# Patient Record
Sex: Female | Born: 1980 | Race: White | Hispanic: No | Marital: Married | Smoking: Never smoker
Health system: Southern US, Community
[De-identification: ages and names within clinical notes are randomized; demographics above are authoritative.]

## PROBLEM LIST (undated history)

## (undated) DIAGNOSIS — N83202 Unspecified ovarian cyst, left side: Secondary | ICD-10-CM

## (undated) DIAGNOSIS — F419 Anxiety disorder, unspecified: Secondary | ICD-10-CM

## (undated) DIAGNOSIS — F32A Depression, unspecified: Secondary | ICD-10-CM

## (undated) DIAGNOSIS — N809 Endometriosis, unspecified: Secondary | ICD-10-CM

## (undated) DIAGNOSIS — N979 Female infertility, unspecified: Secondary | ICD-10-CM

## (undated) DIAGNOSIS — Z8742 Personal history of other diseases of the female genital tract: Secondary | ICD-10-CM

## (undated) DIAGNOSIS — Z973 Presence of spectacles and contact lenses: Secondary | ICD-10-CM

## (undated) DIAGNOSIS — F329 Major depressive disorder, single episode, unspecified: Secondary | ICD-10-CM

## (undated) HISTORY — DX: Depression, unspecified: F32.A

## (undated) HISTORY — DX: Anxiety disorder, unspecified: F41.9

## (undated) HISTORY — PX: WISDOM TOOTH EXTRACTION: SHX21

## (undated) HISTORY — DX: Major depressive disorder, single episode, unspecified: F32.9

---
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
## 2005-10-17 HISTORY — PX: NASAL SEPTUM SURGERY: SHX37

## 2011-10-25 ENCOUNTER — Ambulatory Visit (INDEPENDENT_AMBULATORY_CARE_PROVIDER_SITE_OTHER): Payer: BC Managed Care – PPO | Admitting: Obstetrics & Gynecology

## 2011-10-25 ENCOUNTER — Encounter: Payer: Self-pay | Admitting: Obstetrics & Gynecology

## 2011-10-25 ENCOUNTER — Other Ambulatory Visit: Payer: Self-pay | Admitting: Obstetrics & Gynecology

## 2011-10-25 DIAGNOSIS — F419 Anxiety disorder, unspecified: Secondary | ICD-10-CM | POA: Insufficient documentation

## 2011-10-25 DIAGNOSIS — Z803 Family history of malignant neoplasm of breast: Secondary | ICD-10-CM

## 2011-10-25 DIAGNOSIS — F411 Generalized anxiety disorder: Secondary | ICD-10-CM

## 2011-10-25 DIAGNOSIS — N942 Vaginismus: Secondary | ICD-10-CM

## 2011-10-25 DIAGNOSIS — F32A Depression, unspecified: Secondary | ICD-10-CM | POA: Insufficient documentation

## 2011-10-25 DIAGNOSIS — F329 Major depressive disorder, single episode, unspecified: Secondary | ICD-10-CM | POA: Insufficient documentation

## 2011-10-25 NOTE — Progress Notes (Signed)
Addended by: Granville Lewis on: 10/25/2011 05:01 PM   Modules accepted: Orders

## 2011-10-25 NOTE — Progress Notes (Signed)
  Subjective:     Sierra Morrison is a 31 y.o. female here for a routine exam.  Current complaints: painful sex, recurrent vaginitis.  Personal health questionnaire reviewed: yes.   Gynecologic History Patient's last menstrual period was 10/19/2011. Contraception: condoms and OCP (estrogen/progesterone) Last Pap: 9/12. Results were: normal Last mammogram: n/a.   Obstetric History OB History    Grav Para Term Preterm Abortions TAB SAB Ect Mult Living   0 0 0 0 0 0 0 0 0 0        The following portions of the patient's history were reviewed and updated as appropriate: allergies, current medications, past family history, past medical history, past social history, past surgical history and problem list.  Review of Systems A comprehensive review of systems was negative except for: Genitourinary: positive for vaginal discharge and dyspaurenia    Objective:    BP 113/74  Pulse 75  Temp(Src) 98 F (36.7 C) (Oral)  Resp 16  Ht 5\' 2"  (1.575 m)  Wt 118 lb (53.524 kg)  BMI 21.58 kg/m2  LMP 10/19/2011 General appearance: alert and no distress Head: Normocephalic, without obvious abnormality, atraumatic Neck: no adenopathy, supple, symmetrical, trachea midline and thyroid not enlarged, symmetric, no tenderness/mass/nodules Back: symmetric, no curvature. ROM normal. No CVA tenderness. Lungs: clear to auscultation bilaterally Breasts: normal appearance, no masses or tenderness Heart: regular rate and rhythm Abdomen: soft, non-tender; bowel sounds normal; no masses,  no organomegaly Pelvic: cervix normal in appearance, external genitalia normal, no adnexal masses or tenderness, no cervical motion tenderness, perianal skin: no external genital warts noted, rectovaginal septum normal, uterus normal size, shape, and consistency and discharge in vagina.  Tight introitus c/w vaginissmus.  uterus retroflexed, difficult to feel adnexa due to pt tightening abdomen. Extremities: extremities normal,  atraumatic, no cyanosis or edema    Assessment:    Healthy female exam.    Plan:    Contraception: OCP (estrogen/progesterone). Follow up in: 4 weeks. vaginal d/c--BD assue adn [facility] Needs BRCA testing.  Sierra Morrison to call to get blood drawn

## 2011-10-26 ENCOUNTER — Telehealth: Payer: Self-pay | Admitting: *Deleted

## 2011-10-26 LAB — WET PREP BY MOLECULAR PROBE
Candida species: NEGATIVE
Trichomonas vaginosis: NEGATIVE

## 2011-10-26 LAB — GC/CHLAMYDIA PROBE AMP, GENITAL: GC Probe Amp, Genital: NEGATIVE

## 2011-10-26 NOTE — Telephone Encounter (Signed)
LM for pt to call office if she wanted to have the [facility] testing done on herself since she has such a positive family history.

## 2011-11-03 ENCOUNTER — Other Ambulatory Visit (INDEPENDENT_AMBULATORY_CARE_PROVIDER_SITE_OTHER): Payer: BC Managed Care – PPO

## 2011-11-03 DIAGNOSIS — Z809 Family history of malignant neoplasm, unspecified: Secondary | ICD-10-CM

## 2011-11-03 NOTE — Progress Notes (Signed)
Pt here for [facility] only today.  Will notify pt when receive results.

## 2011-11-24 ENCOUNTER — Encounter: Payer: Self-pay | Admitting: Obstetrics & Gynecology

## 2011-11-30 ENCOUNTER — Encounter: Payer: Self-pay | Admitting: Obstetrics & Gynecology

## 2011-12-09 ENCOUNTER — Encounter: Payer: Self-pay | Admitting: Physician Assistant

## 2011-12-09 ENCOUNTER — Ambulatory Visit (INDEPENDENT_AMBULATORY_CARE_PROVIDER_SITE_OTHER): Payer: BC Managed Care – PPO | Admitting: Physician Assistant

## 2011-12-09 VITALS — BP 107/64 | HR 80 | Ht 62.5 in | Wt 118.0 lb

## 2011-12-09 DIAGNOSIS — Z23 Encounter for immunization: Secondary | ICD-10-CM

## 2011-12-09 DIAGNOSIS — Z7689 Persons encountering health services in other specified circumstances: Secondary | ICD-10-CM

## 2011-12-09 DIAGNOSIS — F329 Major depressive disorder, single episode, unspecified: Secondary | ICD-10-CM

## 2011-12-09 DIAGNOSIS — F341 Dysthymic disorder: Secondary | ICD-10-CM

## 2011-12-09 MED ORDER — SERTRALINE HCL 25 MG PO TABS: 25.0000 mg | ORAL_TABLET | Freq: Every day | ORAL | Status: DC

## 2011-12-09 NOTE — Progress Notes (Signed)
  Subjective:    Patient ID: Sierra Morrison, female    DOB: 1981-09-21, 31 y.o.   MRN: [id number]  HPI Patient presents to clinic to establish care. Patient past medical history was reviewed today with patient. She reports no problems or concerns today. She has an extensive history of anxiety and depression since youth. She believes that most of it is due to her "dysfunctional family" she states that when she is with them her anxiety gets worse. She recently had to be around them and she had 2 panic attacks; however, she felt great before and after. She really wants to go off of Zoloft. She had been on 100mg  daily but 3 months ago went down to 50mg  daily. Patient has a lot of good things happening in her life right now. She has a boyfriend of 5 years that she hopes to get engaged too. She is at a good place.   Review of Systems     Objective:   Physical Exam  Constitutional: She is oriented to person, place, and time. She appears well-developed and well-nourished.  HENT:  Head: Normocephalic and atraumatic.  Cardiovascular: Normal rate, regular rhythm, normal heart sounds and intact distal pulses.   Pulmonary/Chest: Effort normal and breath sounds normal.  Neurological: She is alert and oriented to person, place, and time.  Psychiatric: She has a normal mood and affect. Her behavior is normal.          Assessment & Plan:  Anxiety and Depression- Decrease Zoloft 25mg  daily for 2 months then stop. Encouraged patient to exercise and maintain an healthy diet. F/U with office if having any problems after discontinuing medications.   Tdap given today.

## 2011-12-09 NOTE — Patient Instructions (Signed)
Changed Zoloft to 25mg  daily for 2 months then stop. F/U when needed.

## 2011-12-21 ENCOUNTER — Encounter: Payer: Self-pay | Admitting: Emergency Medicine

## 2011-12-21 ENCOUNTER — Emergency Department
Admission: EM | Admit: 2011-12-21 | Discharge: 2011-12-21 | Disposition: A | Discharge status: Home / Self Care | Payer: BC Managed Care – PPO | Source: Home / Self Care | Attending: Family Medicine | Admitting: Family Medicine

## 2011-12-21 DIAGNOSIS — J069 Acute upper respiratory infection, unspecified: Secondary | ICD-10-CM

## 2011-12-21 MED ORDER — BENZONATATE 200 MG PO CAPS: 200.0000 mg | ORAL_CAPSULE | Freq: Every day | ORAL | Status: AC

## 2011-12-21 MED ORDER — AZITHROMYCIN 250 MG PO TABS
ORAL_TABLET | ORAL | Status: AC
Start: 2011-12-21 — End: 2011-12-26

## 2011-12-21 NOTE — ED Notes (Signed)
Congestion, runny nose, headache, left ear popping x 6 days

## 2011-12-21 NOTE — ED Provider Notes (Signed)
History     CSN: [id number]  Arrival date & time 12/21/11  1609   First MD Initiated Contact with Patient 12/21/11 1621      Chief Complaint  Patient presents with  . Sinus Problem     HPI Comments: Patient complains of approximately 5 day history of gradually progressive URI symptoms beginning with a mild sore throat (now improved), followed by progressive nasal congestion.  A cough started today.  Complains of fatigue and initial myalgias.  Cough has been generally non-productive.  There has been no pleuritic pain, shortness of breath, or wheezes.   The history is provided by the patient.    Past Medical History  Diagnosis Date  . Anxiety   . Depression   . Abnormal Pap smear of cervix     Past Surgical History  Procedure Date  . Wisdom tooth extraction   . Mole removal 2003    spit nevi    Family History  Problem Relation Age of Onset  . Cancer Mother     breast  . Cancer Mother     skin  . Cancer Maternal Grandmother     breast  . Cancer Maternal Aunt     breast    History  Substance Use Topics  . Smoking status: Never Smoker   . Smokeless tobacco: Never Used  . Alcohol Use: 0.6 oz/week    1 Glasses of wine per week     occassionally    OB History    Grav Para Term Preterm Abortions TAB SAB Ect Mult Living   0 0 0 0 0 0 0 0 0 0       Review of Systems + sore throat + cough No pleuritic pain No wheezing + nasal congestion + post-nasal drainage ? sinus pain/pressure No itchy/red eyes ? left earache No hemoptysis No SOB No fever/chills No nausea No vomiting No abdominal pain No diarrhea No urinary symptoms No skin rashes + fatigue + myalgias + headache Used OTC meds without relief  Allergies  Review of patient's allergies indicates not on file.  Home Medications   Current Outpatient Rx  Name Route Sig Dispense Refill  . AZITHROMYCIN 250 MG PO TABS  Take 2 tabs today; then begin one tab once daily for 4 more days (Rx void after  12/29/11) 6 each 0  . BENZONATATE 200 MG PO CAPS Oral Take 1 capsule (200 mg total) by mouth at bedtime. Take as needed for cough 12 capsule 0  . CALCIUM 1200-1000 MG-UNIT PO CHEW Oral Chew by mouth daily.      Marland Kitchen ONE-DAILY MULTI VITAMINS PO TABS Oral Take 1 tablet by mouth daily.      . SERTRALINE HCL 25 MG PO TABS Oral Take 1 tablet (25 mg total) by mouth daily. 30 tablet 1  . TRI-SPRINTEC 0.18/0.215/0.25 MG-35 MCG PO TABS        BP 111/71  Pulse 75  Temp(Src) 97.7 F (36.5 C) (Oral)  Resp 16  Ht 5\' 2"  (1.575 m)  Wt 121 lb (54.885 kg)  BMI 22.13 kg/m2  SpO2 99%  LMP 11/18/2011  Physical Exam Nursing notes and Vital Signs reviewed. Appearance:  Patient appears healthy, stated age, and in no acute distress Eyes:  Pupils are equal, round, and reactive to light and accomodation.  Extraocular movement is intact.  Conjunctivae are not inflamed  Ears:  Canals normal.  Tympanic membranes normal.  Nose:  Mildly congested turbinates.  No sinus tenderness.  Pharynx:  Normal Neck:  Supple.  Slightly tender shotty posterior nodes are palpated bilaterally  Lungs:  Clear to auscultation.  Breath sounds are equal.  Heart:  Regular rate and rhythm without murmurs, rubs, or gallops.  Abdomen:  Nontender without masses or hepatosplenomegaly.  Bowel sounds are present.  No CVA or flank tenderness.  Extremities:  No edema.  No calf tenderness Skin:  No rash present.   ED Course  Procedures none      1. Acute upper respiratory infections of unspecified site       MDM  There is no evidence of bacterial infection today.   Treat symptomatically for now: Rx written for Tessalon at bedtime. Take Mucinex D (guaifenesin with decongestant) twice daily for congestion.  Increase fluid intake, rest. May use Afrin nasal spray (or generic oxymetazoline) twice daily for about 5 days.  Also recommend using saline nasal spray several times daily and saline nasal irrigation (AYR is a common brand) Stop  all antihistamines for now, and other non-prescription cough/cold preparations. Begin Azithromycin if not improving about 5 days or if persistent fever develops. Follow-up with family doctor if not improving 7 to 10 days.        Donna Christen, MD 12/21/11 [phone]

## 2011-12-21 NOTE — Discharge Instructions (Signed)
Take Mucinex D (guaifenesin with decongestant) twice daily for congestion.  Increase fluid intake, rest. °May use Afrin nasal spray (or generic oxymetazoline) twice daily for about 5 days.  Also recommend using saline nasal spray several times daily and saline nasal irrigation (AYR is a common brand) °Stop all antihistamines for now, and other non-prescription cough/cold preparations. °Begin Azithromycin if not improving about 5 days or if persistent fever develops. °Follow-up with family doctor if not improving 7 to 10 days.  °

## 2012-02-28 ENCOUNTER — Other Ambulatory Visit: Payer: Self-pay | Admitting: Physician Assistant

## 2012-07-05 ENCOUNTER — Encounter: Payer: Self-pay | Admitting: Obstetrics & Gynecology

## 2012-07-05 ENCOUNTER — Ambulatory Visit (INDEPENDENT_AMBULATORY_CARE_PROVIDER_SITE_OTHER): Payer: BC Managed Care – PPO | Admitting: Obstetrics & Gynecology

## 2012-07-05 VITALS — BP 105/71 | HR 73 | Ht 62.5 in | Wt 117.0 lb

## 2012-07-05 DIAGNOSIS — Z1151 Encounter for screening for human papillomavirus (HPV): Secondary | ICD-10-CM

## 2012-07-05 DIAGNOSIS — Z01419 Encounter for gynecological examination (general) (routine) without abnormal findings: Secondary | ICD-10-CM

## 2012-07-05 DIAGNOSIS — Z113 Encounter for screening for infections with a predominantly sexual mode of transmission: Secondary | ICD-10-CM

## 2012-07-05 DIAGNOSIS — Z124 Encounter for screening for malignant neoplasm of cervix: Secondary | ICD-10-CM

## 2012-07-05 MED ORDER — NORGESTIM-ETH ESTRAD TRIPHASIC 0.18/0.215/0.25 MG-35 MCG PO TABS: ORAL_TABLET | ORAL | Status: DC

## 2012-07-05 NOTE — Patient Instructions (Signed)
Place premenopausal annual exam patient instructions here.  °

## 2012-07-05 NOTE — Addendum Note (Signed)
Addended by: Barbara Cower on: 07/05/2012 04:27 PM   Modules accepted: Orders

## 2012-07-05 NOTE — Progress Notes (Signed)
  Subjective:     Sierra Morrison is a 31 y.o. female here for a routine exam.  Current complaints: none.  Personal health questionnaire reviewed: yes.   Gynecologic History Patient's last menstrual period was 06/04/2012. Contraception: OCP (estrogen/progesterone) Last Pap: Nml per patient; has had an abnormal in the past requiring a repap.  Obstetric History OB History    Grav Para Term Preterm Abortions TAB SAB Ect Mult Living   0 0 0 0 0 0 0 0 0 0        The following portions of the patient's history were reviewed and updated as appropriate: allergies, current medications, past family history, past medical history, past social history, past surgical history and problem list.  Review of Systems A comprehensive review of systems was negative.    Objective:    Vitals:  WNL General appearance: alert, cooperative and no distress Head: Normocephalic, without obvious abnormality, atraumatic Eyes: negative Throat: lips, mucosa, and tongue normal; teeth and gums normal Lungs: clear to auscultation bilaterally Breasts: normal appearance, no masses or tenderness, No nipple retraction or dimpling, No nipple discharge or bleeding Heart: regular rate and rhythm Abdomen: soft, non-tender; bowel sounds normal; no masses,  no organomegaly Pelvic: cervix normal in appearance, external genitalia normal, no adnexal masses or tenderness, no bladder tenderness, no cervical motion tenderness, perianal skin: no external genital warts noted, introitus is tight due to muscle contraction (vaginismus)l, urethra without abnormality or discharge, uterus normal size, shape, and consistency and vagina normal without discharge. Extremities: no edema, redness or tenderness in the calves or thighs Skin: no lesions or rash Lymph nodes: Axillary adenopathy: none       Assessment:    Healthy female exam.    Plan:    Education reviewed: self breast exams and skin cancer screening. Contraception: oral  progesterone-only contraceptive. Follow up in: 1 year. Return to pelvic PT at [facility] when ready

## 2012-08-07 ENCOUNTER — Encounter: Payer: Self-pay | Admitting: Family Medicine

## 2012-08-07 ENCOUNTER — Ambulatory Visit: Payer: BC Managed Care – PPO | Admitting: Family Medicine

## 2012-08-07 ENCOUNTER — Ambulatory Visit (INDEPENDENT_AMBULATORY_CARE_PROVIDER_SITE_OTHER): Payer: BC Managed Care – PPO | Admitting: Family Medicine

## 2012-08-07 VITALS — BP 103/68 | HR 84 | Ht 62.0 in | Wt 118.0 lb

## 2012-08-07 DIAGNOSIS — F341 Dysthymic disorder: Secondary | ICD-10-CM

## 2012-08-07 DIAGNOSIS — F418 Other specified anxiety disorders: Secondary | ICD-10-CM

## 2012-08-07 MED ORDER — SERTRALINE HCL 50 MG PO TABS: 50.0000 mg | ORAL_TABLET | Freq: Every day | ORAL | Status: DC

## 2012-08-07 NOTE — Patient Instructions (Signed)

## 2012-08-07 NOTE — Progress Notes (Addendum)
  Subjective:    Patient ID: Sierra Morrison, female    DOB: [birth year], 31 y.o.   MRN: [id number]  HPI  Patient's here for anxiety and depression. She states she weaned herself from a high of 200 mg of Zoloft a day to 50 mg daily and when she saw Ms Caleen Essex in April she requested to come off the medication and she was weaned to half a tablet or 25 mg a day and then was able to come off. Initially things went well but she started having anxiety attacks feelings of depression and feeling overwhelmed with her life. She realized that she felt much better on the Zoloft and wants to go back on the medication.  Review of Systems  Psychiatric/Behavioral: Positive for dysphoric mood. Negative for suicidal ideas. The patient is nervous/anxious.       BP 103/68  Pulse 84  Ht 5\' 2"  (1.575 m)  Wt 118 lb (53.524 kg)  BMI 21.58 kg/m2  SpO2 94% Objective:   Physical Exam  Vitals reviewed. Constitutional: She is oriented to person, place, and time. She appears well-developed and well-nourished.  Neurological: She is alert and oriented to person, place, and time.  Skin: Skin is warm.  Psychiatric: Her behavior is normal. Thought content normal. Her mood appears anxious. Her speech is not delayed and not slurred. Cognition and memory are not impaired. She does not express impulsivity. She exhibits a depressed mood. She expresses no homicidal and no suicidal ideation. She expresses no suicidal plans and no homicidal plans.       Patient is tearful at times as she describes her panic attacks that she's been having and discusses her rosacea and then being engaged should be her happiest times and is not.   Gad -7 for anxiety score 15. 3 marked for #1 and #3 and #6, she marked 2  for #2 #7, and 1 marked for #4 and #5. She reports is very difficult for her to function take care of things and her  PHQ 9 was also marked total of 15    Assessment & Plan:  Anxiety depression syndrome. We'll restart on Zoloft 50 mg  one tablet a day.  We'll give her the privilege of increasing her Zoloft 50 mg every 2 weeks up to a maximum of 150. Followup with Ms.Breeback in 2 months.  #2 patient declines flu shot today.

## 2012-08-08 ENCOUNTER — Ambulatory Visit: Payer: BC Managed Care – PPO | Admitting: Physician Assistant

## 2012-08-13 ENCOUNTER — Ambulatory Visit: Payer: BC Managed Care – PPO | Admitting: Sports Medicine

## 2013-01-11 ENCOUNTER — Other Ambulatory Visit: Payer: Self-pay | Admitting: Physician Assistant

## 2013-06-28 ENCOUNTER — Other Ambulatory Visit: Payer: Self-pay | Admitting: Obstetrics & Gynecology

## 2013-07-01 ENCOUNTER — Ambulatory Visit: Payer: BC Managed Care – PPO

## 2013-07-01 ENCOUNTER — Telehealth: Payer: Self-pay | Admitting: *Deleted

## 2013-07-01 DIAGNOSIS — IMO0001 Reserved for inherently not codable concepts without codable children: Secondary | ICD-10-CM

## 2013-07-01 MED ORDER — NORGESTIM-ETH ESTRAD TRIPHASIC 0.18/0.215/0.25 MG-35 MCG PO TABS: ORAL_TABLET | ORAL | Status: DC

## 2013-07-01 NOTE — Telephone Encounter (Signed)
Pt called to have OCP refilled - Sent refill for 1 month and will make appt for pt to have annual

## 2013-07-15 ENCOUNTER — Other Ambulatory Visit: Payer: Self-pay | Admitting: *Deleted

## 2013-07-15 DIAGNOSIS — IMO0001 Reserved for inherently not codable concepts without codable children: Secondary | ICD-10-CM

## 2013-07-15 NOTE — Telephone Encounter (Signed)
Pt has an appt 07/19/13 for annual

## 2013-07-19 ENCOUNTER — Ambulatory Visit (INDEPENDENT_AMBULATORY_CARE_PROVIDER_SITE_OTHER): Payer: 59 | Admitting: Advanced Practice Midwife

## 2013-07-19 ENCOUNTER — Encounter: Payer: Self-pay | Admitting: Advanced Practice Midwife

## 2013-07-19 VITALS — BP 109/73 | HR 68 | Resp 16 | Ht 62.0 in | Wt 114.0 lb

## 2013-07-19 DIAGNOSIS — IMO0001 Reserved for inherently not codable concepts without codable children: Secondary | ICD-10-CM

## 2013-07-19 DIAGNOSIS — Z01419 Encounter for gynecological examination (general) (routine) without abnormal findings: Secondary | ICD-10-CM

## 2013-07-19 DIAGNOSIS — Z309 Encounter for contraceptive management, unspecified: Secondary | ICD-10-CM

## 2013-07-19 DIAGNOSIS — Z803 Family history of malignant neoplasm of breast: Secondary | ICD-10-CM

## 2013-07-19 MED ORDER — NORGESTIM-ETH ESTRAD TRIPHASIC 0.18/0.215/0.25 MG-35 MCG PO TABS: ORAL_TABLET | ORAL | Status: DC

## 2013-07-19 NOTE — Progress Notes (Signed)
   Subjective:     Sierra Morrison is a 32 y.o. female here for a routine well-woman exam.  Current complaints: none.  She is getting married in one month and would like to renew OCPs x1 month, then stop with plans to become pregnant.  She has hx of anxiety, and is taking Zoloft currently, and feels things are well managed currently.  She is concerned about antidepressants in pregnancy but has tried to wean off Zoloft before and did not do well.  She denies current vaginal bleeding, vaginal itching/burning, urinary symptoms, h/a, dizziness, n/v, or fever/chills.     Gynecologic History Patient's last menstrual period was 06/28/2013. Contraception: OCP (estrogen/progesterone) Last Pap: 2013. Results were: normal She is currently sexually active Menses are regular with OCPs, pt unsure if regular before because she was young, age 65, when starting pills  Health screening: Pt has routine eye exams, does light exercise 2-3x/week, wears seatbelt.  She does not have primary care provider and has not had routine cholesterol screening.  Obstetric History OB History  Gravida Para Term Preterm AB SAB TAB Ectopic Multiple Living  0 0 0 0 0 0 0 0 0 0          The following portions of the patient's history were reviewed and updated as appropriate: allergies, current medications, past family history, past medical history, past social history, past surgical history and problem list.  Review of Systems A comprehensive review of systems was negative.    Objective:    BP 109/73  Pulse 68  Resp 16  Ht 5\' 2"  (1.575 m)  Wt 114 lb (51.71 kg)  BMI 20.85 kg/m2  LMP 06/28/2013 General appearance: alert, cooperative, appears stated age and no distress Throat: lips, mucosa, and tongue normal; teeth and gums normal Lungs: clear to auscultation bilaterally Breasts: normal appearance, no masses or tenderness, Inspection negative, No nipple retraction or dimpling, No axillary or supraclavicular adenopathy,  Normal to palpation without dominant masses Heart: regular rate and rhythm, S1, S2 normal, no murmur, click, rub or gallop Abdomen: soft, non-tender; bowel sounds normal; no masses,  no organomegaly Pelvic: external genitalia normal, no adnexal masses or tenderness, no cervical motion tenderness, rectovaginal septum normal, uterus normal size, shape, and consistency and vagina normal without discharge Skin: Skin color, texture, turgor normal. No rashes or lesions Neurologic: Grossly normal    Assessment:    Healthy female exam.    Plan:    Education reviewed: depression evaluation, self breast exams and weight bearing exercise. OCP prescription renewed Pap not collected today--normal in 2013 Return in 1 year or PRN    Sharen Counter Certified Nurse-Midwife

## 2013-08-16 ENCOUNTER — Encounter: Payer: Self-pay | Admitting: Emergency Medicine

## 2013-08-16 ENCOUNTER — Emergency Department (INDEPENDENT_AMBULATORY_CARE_PROVIDER_SITE_OTHER)
Admission: EM | Admit: 2013-08-16 | Discharge: 2013-08-16 | Disposition: A | Discharge status: Home / Self Care | Payer: 59 | Source: Home / Self Care | Attending: Emergency Medicine | Admitting: Emergency Medicine

## 2013-08-16 DIAGNOSIS — H6980 Other specified disorders of Eustachian tube, unspecified ear: Secondary | ICD-10-CM

## 2013-08-16 DIAGNOSIS — H6500 Acute serous otitis media, unspecified ear: Secondary | ICD-10-CM

## 2013-08-16 DIAGNOSIS — H6502 Acute serous otitis media, left ear: Secondary | ICD-10-CM

## 2013-08-16 DIAGNOSIS — H6982 Other specified disorders of Eustachian tube, left ear: Secondary | ICD-10-CM

## 2013-08-16 MED ORDER — FLUTICASONE PROPIONATE 50 MCG/ACT NA SUSP: NASAL | Status: DC

## 2013-08-16 NOTE — ED Notes (Signed)
Sierra Morrison c/o left ear pain x 4 days. She was recently on vacation and feels she got water in her left ear. Mild  Congestion on the left side and HAs.

## 2013-08-16 NOTE — ED Provider Notes (Signed)
CSN: [id number]     Arrival date & time 08/16/13  1716 History   First MD Initiated Contact with Patient 08/16/13 1721     Chief Complaint  Patient presents with  . Otalgia   (Consider location/radiation/quality/duration/timing/severity/associated sxs/prior Treatment) HPI Left earache, pressure feeling, mild to moderate intensity, with mild decreased hearing and muffled feeling left ear. Started after she got water in her ear did snorkeling when she was on her honeymoon in [place]. [place] one week ago. Left nostril congested At times. No colored rhinorrhea. No ear drainage. No sore throat or other ENT symptoms. No fever. Has not tried any treatment for this Past Medical History  Diagnosis Date  . Anxiety   . Depression   . Abnormal Pap smear of cervix    Past Surgical History  Procedure Laterality Date  . Wisdom tooth extraction    . Mole removal  2003    spit nevi   Family History  Problem Relation Age of Onset  . Cancer Mother     breast  . Cancer Mother     skin  . Cancer Maternal Grandmother     breast  . Cancer Maternal Aunt     breast   History  Substance Use Topics  . Smoking status: Never Smoker   . Smokeless tobacco: Never Used  . Alcohol Use: 0.6 oz/week    1 Glasses of wine per week     Comment: occassionally   OB History   Grav Para Term Preterm Abortions TAB SAB Ect Mult Living   0 0 0 0 0 0 0 0 0 0      Review of Systems  All other systems reviewed and are negative.    Allergies  Review of patient's allergies indicates no known allergies.  Home Medications   Current Outpatient Rx  Name  Route  Sig  Dispense  Refill  . fluticasone (FLONASE) 50 MCG/ACT nasal spray      1 or 2 sprays in affected nostril(s) twice a day   16 g   0   . Norgestimate-Ethinyl Estradiol Triphasic (TRI-SPRINTEC) 0.18/0.215/0.25 MG-35 MCG tablet      Take one tablet daily by mouth   1 Package   1   . OVER THE COUNTER MEDICATION   Oral   Take 1 capsule by mouth  daily.         . sertraline (ZOLOFT) 50 MG tablet      TAKE 1-3 TABLETS (50-150 MG TOTAL) BY MOUTH DAILY.   60 tablet   4    BP 120/79  Pulse 65  Temp(Src) 98 F (36.7 C) (Oral)  Resp 14  Ht 5\' 3"  (1.6 m)  Wt 115 lb (52.164 kg)  BMI 20.38 kg/m2  SpO2 100%  LMP 07/13/2013 Physical Exam  Nursing note and vitals reviewed. Constitutional: She is oriented to person, place, and time. She appears well-developed and well-nourished. No distress.  HENT:  Head: Normocephalic and atraumatic.  Right Ear: Tympanic membrane, external ear and ear canal normal.  Left Ear: External ear and ear canal normal. No drainage or tenderness. No foreign bodies. No mastoid tenderness. Tympanic membrane is not injected, not scarred, not perforated, not erythematous and not bulging. Tympanic membrane mobility is abnormal. A middle ear effusion (Serous) is present. No hemotympanum.  Nose: Mucosal edema (Left nostril) present. No rhinorrhea. Right sinus exhibits no maxillary sinus tenderness and no frontal sinus tenderness. Left sinus exhibits no maxillary sinus tenderness and no frontal sinus tenderness.  Mouth/Throat: Oropharynx  is clear and moist. No oral lesions.  Eyes: Conjunctivae are normal. No scleral icterus.  Neck: Neck supple.  Cardiovascular: Normal rate, regular rhythm and normal heart sounds.   Pulmonary/Chest: Effort normal and breath sounds normal.  Lymphadenopathy:    She has no cervical adenopathy.  Neurological: She is alert and oriented to person, place, and time.  Skin: Skin is warm and dry.    ED Course  Procedures (including critical care time) Labs Review Labs Reviewed - No data to display Imaging Review No results found.  EKG Interpretation     Ventricular Rate:    PR Interval:    QRS Duration:   QT Interval:    QTC Calculation:   R Axis:     Text Interpretation:              MDM   1. Acute serous otitis media of left ear   2. Eustachian tube dysfunction,  left    Discussed diagnosis. After risks, benefits, alternatives discussed, she agrees with the following plans: Flonase. OTC Zyrtec Would expect this to resolve within one to 2 weeks, but if not, followup with PCP or ENT. Precautions discussed. Red flags discussed. Questions invited and answered. Patient voiced understanding and agreement.    Lajean Manes, MD 08/16/13 [phone]

## 2014-02-13 ENCOUNTER — Ambulatory Visit (INDEPENDENT_AMBULATORY_CARE_PROVIDER_SITE_OTHER): Payer: Commercial Managed Care - PPO | Admitting: Obstetrics & Gynecology

## 2014-02-13 ENCOUNTER — Encounter: Payer: Self-pay | Admitting: Obstetrics & Gynecology

## 2014-02-13 VITALS — BP 132/83 | HR 79 | Resp 16 | Ht 62.0 in | Wt 118.0 lb

## 2014-02-13 DIAGNOSIS — N940 Mittelschmerz: Secondary | ICD-10-CM

## 2014-02-13 MED ORDER — OXYCODONE-ACETAMINOPHEN 5-325 MG PO TABS: 1.0000 | ORAL_TABLET | Freq: Four times a day (QID) | ORAL | Status: DC | PRN

## 2014-02-13 NOTE — Progress Notes (Signed)
   Subjective:    Patient ID: Wendie SimmerKristen Lacomb, female    DOB: 22-Jul-1981, 33 y.o.   MRN: [id number]  HPI  33 yo MW G0 here today to discuss pelvic pain, midline, below the umbilicus for the last 10 days. She was seen at [facility] for some vaginal pain with pelvic PT. That issue has improved and is different from this pain. She is trying to get pregnant. Periods are "very regular"   Review of Systems     Objective:   Physical Exam        Assessment & Plan:  Mittelschmirz- reassurance given Rec IBU/percocet prn

## 2014-04-16 ENCOUNTER — Encounter: Payer: Self-pay | Admitting: Obstetrics & Gynecology

## 2014-04-16 ENCOUNTER — Ambulatory Visit (INDEPENDENT_AMBULATORY_CARE_PROVIDER_SITE_OTHER): Payer: Commercial Managed Care - PPO | Admitting: Obstetrics & Gynecology

## 2014-04-16 VITALS — BP 110/72 | HR 82 | Resp 16 | Ht 62.0 in | Wt 118.0 lb

## 2014-04-16 DIAGNOSIS — N946 Dysmenorrhea, unspecified: Secondary | ICD-10-CM

## 2014-04-16 NOTE — Progress Notes (Signed)
Pt presents to talk about bleeding pattern of menstrual cycle, infertility and dysmenorrhea.  Pt was on OCPs most of her life until 6 months ago.  Pt having brown spotting for 2 days, then red bleeding for 2 days then brown spotting for 2 days.  Bleeding is occurring once a month with no spotting mid cycle.  Pt reassured that this is e nml menstrual cycle albeit different then her bleeding pattern on the OCPs.  This is expected.  Pt's dysmenorrhea should be managed with motrin 600 mg q 6 hours.  Dr. Marice Potterove has also given her percocet for severe pain.  Diarrhea can also be nml with menstrual cycle due to prostaglandin release.    Pt should see us in 2-3 onths if not pregnant and we can order semen analysis and HSG.    Total time spent with patient 20 mins.

## 2014-04-23 ENCOUNTER — Ambulatory Visit: Payer: Commercial Managed Care - PPO | Admitting: Obstetrics & Gynecology

## 2014-06-15 ENCOUNTER — Encounter: Payer: Self-pay | Admitting: Emergency Medicine

## 2014-06-15 ENCOUNTER — Emergency Department (INDEPENDENT_AMBULATORY_CARE_PROVIDER_SITE_OTHER)
Admission: EM | Admit: 2014-06-15 | Discharge: 2014-06-15 | Disposition: A | Discharge status: Home / Self Care | Payer: Commercial Managed Care - PPO | Source: Home / Self Care | Attending: Family Medicine | Admitting: Family Medicine

## 2014-06-15 DIAGNOSIS — R350 Frequency of micturition: Secondary | ICD-10-CM

## 2014-06-15 DIAGNOSIS — N3000 Acute cystitis without hematuria: Secondary | ICD-10-CM

## 2014-06-15 DIAGNOSIS — R300 Dysuria: Secondary | ICD-10-CM

## 2014-06-15 DIAGNOSIS — R309 Painful micturition, unspecified: Secondary | ICD-10-CM

## 2014-06-15 LAB — POCT URINALYSIS DIP (MANUAL ENTRY)
BILIRUBIN UA: NEGATIVE
BILIRUBIN UA: NEGATIVE
Glucose, UA: NEGATIVE
NITRITE UA: NEGATIVE
PH UA: 7 (ref 5–8)
Spec Grav, UA: 1.02 (ref 1.005–1.03)
Urobilinogen, UA: 0.2 (ref 0–1)

## 2014-06-15 MED ORDER — NITROFURANTOIN MONOHYD MACRO 100 MG PO CAPS: 100.0000 mg | ORAL_CAPSULE | Freq: Two times a day (BID) | ORAL | Status: DC

## 2014-06-15 NOTE — ED Provider Notes (Signed)
CSN: [id number]     Arrival date & time 06/15/14  1634 History   First MD Initiated Contact with Patient 06/15/14 1648     Chief Complaint  Patient presents with  . Urinary Frequency  . Dysuria      HPI Comments: Patient complains of two day history of frequency, dysuria, and urgency.  No fevers, chills, and sweats.  Patient's last menstrual period was 05/25/2014.   Patient is a 33 y.o. female presenting with dysuria. The history is provided by the patient.  Dysuria Pain quality:  Burning Pain severity:  Moderate Onset quality:  Sudden Duration:  2 days Timing:  Constant Progression:  Worsening Chronicity:  New Recent urinary tract infections: no   Relieved by:  Nothing Worsened by:  Nothing tried Ineffective treatments:  None tried Urinary symptoms: frequent urination and hesitancy   Urinary symptoms: no discolored urine, no foul-smelling urine, no hematuria and no bladder incontinence   Associated symptoms: no abdominal pain, no fever, no flank pain, no genital lesions, no nausea, no vaginal discharge and no vomiting     Past Medical History  Diagnosis Date  . Anxiety   . Depression   . Abnormal Pap smear of cervix    Past Surgical History  Procedure Laterality Date  . Wisdom tooth extraction    . Mole removal  2003    spit nevi   Family History  Problem Relation Age of Onset  . Cancer Mother     breast  . Cancer Mother     skin  . Cancer Maternal Grandmother     breast  . Cancer Maternal Aunt     breast   History  Substance Use Topics  . Smoking status: Never Smoker   . Smokeless tobacco: Never Used  . Alcohol Use: 0.6 oz/week    1 Glasses of wine per week     Comment: occassionally   OB History   Grav Para Term Preterm Abortions TAB SAB Ect Mult Living       Review of Systems  Constitutional: Negative for fever.  Gastrointestinal: Negative for nausea, vomiting and abdominal pain.  Genitourinary: Positive for dysuria. Negative  for flank pain and vaginal discharge.  All other systems reviewed and are negative.   Allergies  Review of patient's allergies indicates no known allergies.  Home Medications   Prior to Admission medications   Medication Sig Start Date End Date Taking? Authorizing Provider  Prenatal MV-Min-Fe Fum-FA-DHA (PRENATAL 1 PO) Take by mouth.   Yes Historical Provider, MD  fluticasone (FLONASE) 50 MCG/ACT nasal spray 1 or 2 sprays in affected nostril(s) twice a day 08/16/13   Lajean Manes, MD  nitrofurantoin, macrocrystal-monohydrate, (MACROBID) 100 MG capsule Take 1 capsule (100 mg total) by mouth 2 (two) times daily. Take with food. 06/15/14   Lattie Haw, MD  oxyCODONE-acetaminophen (PERCOCET/ROXICET) 5-325 MG per tablet Take 1-2 tablets by mouth every 6 (six) hours as needed. 02/13/14   Allie Bossier, MD   BP 125/70  Pulse 75  Temp(Src) 98.5 F (36.9 C) (Oral)  Ht  (1.575 m)  Wt 119 lb (53.978 kg)  BMI 21.76 kg/m2  SpO2 100%  LMP 05/25/2014 Physical Exam Nursing notes and Vital Signs reviewed. Appearance:  Patient appears healthy, stated age, and in no acute distress Eyes:  Pupils are equal, round, and reactive to light and accomodation.  Extraocular movement is intact.  Conjunctivae are not inflamed  Pharynx:  Normal; moist mucous membranes  Neck:  Supple.  No adenopathy Lungs:  Clear to auscultation.  Breath sounds are equal.  Heart:  Regular rate and rhythm without murmurs, rubs, or gallops.  Abdomen:  Nontender without masses or hepatosplenomegaly.  Bowel sounds are present.  No CVA or flank tenderness.  Extremities:  No edema.  No calf tenderness Skin:  No rash present.   ED Course  Procedures  None   Labs Reviewed  POCT URINALYSIS DIP (MANUAL ENTRY) - Abnormal; Notable for the following:  BLO moderate; PRO /dL; LEU moderate  URINE CULTURE      MDM   1. Frequent urination   2. Painful urination   3. Acute cystitis without hematuria    Urine culture  pending.  Begin Macrobid.  Increase fluid intake. If symptoms become significantly worse during the night or over the weekend, proceed to the local emergency room.  Followup with Family Doctor if not improved in one week.    Lattie Haw, MD 06/20/14 [phone]

## 2014-06-15 NOTE — ED Notes (Signed)
Sierra Morrison complains of frequent and painful urination for 2 days. Denies fever, chills or sweats.

## 2014-06-15 NOTE — Discharge Instructions (Signed)
Increase fluid intake. °If symptoms become significantly worse during the night or over the weekend, proceed to the local emergency room.  ° ° °Urinary Tract Infection °Urinary tract infections (UTIs) can develop anywhere along your urinary tract. Your urinary tract is your body's drainage system for removing wastes and extra water. Your urinary tract includes two kidneys, two ureters, a bladder, and a urethra. Your kidneys are a pair of bean-shaped organs. Each kidney is about the size of your fist. They are located below your ribs, one on each side of your spine. °CAUSES °Infections are caused by microbes, which are microscopic organisms, including fungi, viruses, and bacteria. These organisms are so small that they can only be seen through a microscope. Bacteria are the microbes that most commonly cause UTIs. °SYMPTOMS  °Symptoms of UTIs may vary by age and gender of the patient and by the location of the infection. Symptoms in young women typically include a frequent and intense urge to urinate and a painful, burning feeling in the bladder or urethra during urination. Older women and men are more likely to be tired, shaky, and weak and have muscle aches and abdominal pain. A fever may mean the infection is in your kidneys. Other symptoms of a kidney infection include pain in your back or sides below the ribs, nausea, and vomiting. °DIAGNOSIS °To diagnose a UTI, your caregiver will ask you about your symptoms. Your caregiver also will ask to provide a urine sample. The urine sample will be tested for bacteria and white blood cells. White blood cells are made by your body to help fight infection. °TREATMENT  °Typically, UTIs can be treated with medication. Because most UTIs are caused by a bacterial infection, they usually can be treated with the use of antibiotics. The choice of antibiotic and length of treatment depend on your symptoms and the type of bacteria causing your infection. °HOME CARE  INSTRUCTIONS °· If you were prescribed antibiotics, take them exactly as your caregiver instructs you. Finish the medication even if you feel better after you have only taken some of the medication. °· Drink enough water and fluids to keep your urine clear or pale yellow. °· Avoid caffeine, tea, and carbonated beverages. They tend to irritate your bladder. °· Empty your bladder often. Avoid holding urine for long periods of time. °· Empty your bladder before and after sexual intercourse. °· After a bowel movement, women should cleanse from front to back. Use each tissue only once. °SEEK MEDICAL CARE IF:  °· You have back pain. °· You develop a fever. °· Your symptoms do not begin to resolve within 3 days. °SEEK IMMEDIATE MEDICAL CARE IF:  °· You have severe back pain or lower abdominal pain. °· You develop chills. °· You have nausea or vomiting. °· You have continued burning or discomfort with urination. °MAKE SURE YOU:  °· Understand these instructions. °· Will watch your condition. °· Will get help right away if you are not doing well or get worse. °Document Released: 07/13/2005 Document Revised: 04/03/2012 Document Reviewed: 11/11/2011 °[employer]® Patient Information ©2015 [employer]. This information is not intended to replace advice given to you by your health care provider. Make sure you discuss any questions you have with your health care provider. ° °

## 2014-06-18 ENCOUNTER — Telehealth: Payer: Self-pay | Admitting: *Deleted

## 2014-06-18 LAB — URINE CULTURE: Colony Count: 70000

## 2014-07-04 ENCOUNTER — Encounter: Payer: Self-pay | Admitting: Physician Assistant

## 2014-07-04 ENCOUNTER — Ambulatory Visit (INDEPENDENT_AMBULATORY_CARE_PROVIDER_SITE_OTHER): Payer: Commercial Managed Care - PPO | Admitting: Physician Assistant

## 2014-07-04 VITALS — BP 132/74 | HR 91 | Ht 62.0 in | Wt 120.0 lb

## 2014-07-04 DIAGNOSIS — F3289 Other specified depressive episodes: Secondary | ICD-10-CM

## 2014-07-04 DIAGNOSIS — F411 Generalized anxiety disorder: Secondary | ICD-10-CM

## 2014-07-04 DIAGNOSIS — R1030 Lower abdominal pain, unspecified: Secondary | ICD-10-CM

## 2014-07-04 DIAGNOSIS — F32A Depression, unspecified: Secondary | ICD-10-CM

## 2014-07-04 DIAGNOSIS — F329 Major depressive disorder, single episode, unspecified: Secondary | ICD-10-CM

## 2014-07-04 DIAGNOSIS — R109 Unspecified abdominal pain: Secondary | ICD-10-CM

## 2014-07-04 LAB — POCT URINALYSIS DIPSTICK
BILIRUBIN UA: NEGATIVE
Blood, UA: NEGATIVE
GLUCOSE UA: NEGATIVE
Ketones, UA: NEGATIVE
LEUKOCYTES UA: NEGATIVE
Nitrite, UA: NEGATIVE
Protein, UA: NEGATIVE
Spec Grav, UA: 1.015
UROBILINOGEN UA: 0.2
pH, UA: 7

## 2014-07-04 MED ORDER — SERTRALINE HCL 50 MG PO TABS: ORAL_TABLET | ORAL | Status: DC

## 2014-07-06 LAB — URINE CULTURE: Colony Count: >=100000

## 2014-07-07 MED ORDER — NITROFURANTOIN MONOHYD MACRO 100 MG PO CAPS: 100.0000 mg | ORAL_CAPSULE | Freq: Two times a day (BID) | ORAL | Status: DC

## 2014-07-07 NOTE — Progress Notes (Signed)
   Subjective:    Patient ID: Sierra Morrison, female    DOB: 05/09/1981, 33 y.o.   MRN: [id number]  HPI Pt presents to the clinic with worsening anxiety and depression. She went on zoloft previously and felt amazing. She decided to try going off of it and things have not been good. She is down and finds herself crying. She is much more worried and cannot sleep. They have been trying for over a 11 months to get pregnant with no success. She is having problems sleeping. Denies any suicidal or homicidal thoughts.   She does have new lower abdominal pain for last few days. Seems like urination is a little more frequent. Some low left flank pain. Not tried anything. No fever, chills, n/v/d. Negative pregnancy test with period September 8th.    Review of Systems  All other systems reviewed and are negative.      Objective:   Physical Exam  Constitutional: She is oriented to person, place, and time. She appears well-developed and well-nourished.  HENT:  Head: Normocephalic and atraumatic.  Cardiovascular: Normal rate, regular rhythm and normal heart sounds.   Pulmonary/Chest: Effort normal and breath sounds normal.  No CVA tenderness.   Abdominal: Soft. Bowel sounds are normal. She exhibits no distension and no mass. There is no tenderness. There is no rebound and no guarding.  Neurological: She is alert and oriented to person, place, and time.  Skin: Skin is dry.  Psychiatric: She has a normal mood and affect. Her behavior is normal.          Assessment & Plan:  Anxiety/depression- PHQ-9 was 17. GAD-7 was 12. Will start zoloft again. Discussed how staying on in pregnancy is ok and could be beneficial if you are going to be this up and down. Discuss more with OB/GYN. Follow up in 2 months.   Left flank pain/abdominal pain- .. Results for orders placed in visit on 07/04/14  URINE CULTURE      Result Value Ref Range   Colony Count >=100,000 COLONIES/ML     Organism ID, Bacteria  Multiple bacterial morphotypes present, none     Organism ID, Bacteria predominant. Suggest appropriate recollection if      Organism ID, Bacteria clinically indicated.    POCT URINALYSIS DIPSTICK      Result Value Ref Range   Color, UA yellow     Clarity, UA cloudy     Glucose, UA neg     Bilirubin, UA neg     Ketones, UA neg     Spec Grav, UA 1.015     Blood, UA neg     pH, UA 7.0     Protein, UA neg     Urobilinogen, UA 0.2     Nitrite, UA neg     Leukocytes, UA Negative     Cultured with bacteria seen more likely contamination but with quanity and symptoms. Called pt that we would treat with marobid. Follow up if symptoms worsen or do not resolve.

## 2014-09-03 ENCOUNTER — Encounter: Payer: Self-pay | Admitting: Physician Assistant

## 2014-09-03 ENCOUNTER — Ambulatory Visit (INDEPENDENT_AMBULATORY_CARE_PROVIDER_SITE_OTHER): Payer: Commercial Managed Care - PPO | Admitting: Physician Assistant

## 2014-09-03 VITALS — BP 96/62 | HR 61 | Ht 62.0 in | Wt 115.0 lb

## 2014-09-03 DIAGNOSIS — F329 Major depressive disorder, single episode, unspecified: Secondary | ICD-10-CM

## 2014-09-03 DIAGNOSIS — F32A Depression, unspecified: Secondary | ICD-10-CM

## 2014-09-03 DIAGNOSIS — F419 Anxiety disorder, unspecified: Secondary | ICD-10-CM

## 2014-09-03 MED ORDER — SERTRALINE HCL 100 MG PO TABS: 100.0000 mg | ORAL_TABLET | Freq: Every day | ORAL | Status: DC

## 2014-09-03 NOTE — Progress Notes (Signed)
   Subjective:    Patient ID: Wendie SimmerKristen Halley, female    DOB: [birth year], 33 y.o.   MRN: [id number]  HPI Patient is a 33 year old female who presents to the clinic with ongoing anxiety and depression. She is currently on Zoloft 100 mg and doing well. She feels very controlled on this dosage. She has a lot less down numbness as well as anxiety. She denies any suicidal or homicidal thoughts. She denies any side effects from medication.   Review of Systems  All other systems reviewed and are negative.      Objective:   Physical Exam  Constitutional: She is oriented to person, place, and time. She appears well-developed and well-nourished.  Cardiovascular: Normal rate, regular rhythm and normal heart sounds.   Neurological: She is alert and oriented to person, place, and time.  Psychiatric: She has a normal mood and affect. Her behavior is normal.          Assessment & Plan:  Anxiety and depression-GAD-7 was 2 and PHQ-9 was 2. Refilled Zoloft 100 mg for 6 months. Follow-up in 6 months.

## 2014-09-14 ENCOUNTER — Other Ambulatory Visit: Payer: Self-pay | Admitting: Physician Assistant

## 2014-10-14 ENCOUNTER — Ambulatory Visit (INDEPENDENT_AMBULATORY_CARE_PROVIDER_SITE_OTHER): Payer: Commercial Managed Care - PPO | Admitting: Obstetrics & Gynecology

## 2014-10-14 ENCOUNTER — Encounter: Payer: Self-pay | Admitting: Obstetrics & Gynecology

## 2014-10-14 VITALS — BP 117/72 | HR 74 | Resp 16 | Ht 62.0 in | Wt 118.0 lb

## 2014-10-14 DIAGNOSIS — N979 Female infertility, unspecified: Secondary | ICD-10-CM

## 2014-10-15 ENCOUNTER — Telehealth: Payer: Self-pay | Admitting: *Deleted

## 2014-10-15 LAB — TSH: TSH: 3.127 u[IU]/mL (ref 0.350–4.500)

## 2014-10-15 MED ORDER — LETROZOLE 2.5 MG PO TABS: ORAL_TABLET | ORAL | Status: DC

## 2014-10-15 NOTE — Progress Notes (Signed)
Pt has now been trying one year without successful pregnancy.  Pt nor husband has ever been pregnant.  Pt has montly cycles lasting about 6 days--spotting x2 days, nml flow x2 days, and then brown spotting x2 days.  This past month she had more spotting days prior to red flow.   Pt denies: GC/Chlam, appendicitis, gyn surgery, bowel issues, abdominal surgeries, hirsutism, Diabetes, psychotropic medications, galactorrhea.  Discussed options of fertility work up including US, semen analysis, HSG, referral to REI. Pt and husband opt for check TSH, and semen analysis. Will then try Femara 2.5 mg days 3-7 of cycle.  Will increase as necessary. HSG if not successful after 3 cycles.  20 minutes spent face to face with patient with >50% counseling.

## 2014-10-15 NOTE — Telephone Encounter (Signed)
LM on voicemail of normal TSH.

## 2015-01-27 ENCOUNTER — Ambulatory Visit: Payer: Commercial Managed Care - PPO | Admitting: Obstetrics & Gynecology

## 2015-02-02 ENCOUNTER — Encounter: Payer: Self-pay | Admitting: Obstetrics & Gynecology

## 2015-02-02 ENCOUNTER — Ambulatory Visit (INDEPENDENT_AMBULATORY_CARE_PROVIDER_SITE_OTHER): Payer: Commercial Managed Care - PPO | Admitting: Obstetrics & Gynecology

## 2015-02-02 VITALS — BP 116/68 | HR 70 | Resp 16 | Ht 62.0 in | Wt 119.0 lb

## 2015-02-02 DIAGNOSIS — N979 Female infertility, unspecified: Secondary | ICD-10-CM | POA: Diagnosis not present

## 2015-02-02 MED ORDER — LETROZOLE 2.5 MG PO TABS: ORAL_TABLET | ORAL | Status: DC

## 2015-02-02 NOTE — Progress Notes (Signed)
34 yo female presents for f/u of infertility.  Pt has taken femara for 3 months.  She had a normal menses with less spotting before (1 day spotting then flow).  Pt had a semen analysis which was nml.  TSH nml.  Pt would like to proceed with HSG and referral to REI for consult & more aggressive treatment.  Dr. Jeannie FendY likes to perform his own HSG so will refer her now so he can perform the HSG.    Will continue femara until she sees him.  Refill given.  Face to face time with patient is 15 minutes with >50% counseling of the following -fertility options -HSG procedure -Semen analysis results -TSH results -REI consult and treatments to expect

## 2015-03-04 ENCOUNTER — Encounter: Payer: Self-pay | Admitting: Family Medicine

## 2015-03-04 ENCOUNTER — Ambulatory Visit (INDEPENDENT_AMBULATORY_CARE_PROVIDER_SITE_OTHER): Payer: Commercial Managed Care - PPO | Admitting: Family Medicine

## 2015-03-04 VITALS — BP 102/66 | HR 67 | Wt 117.0 lb

## 2015-03-04 DIAGNOSIS — F411 Generalized anxiety disorder: Secondary | ICD-10-CM | POA: Diagnosis not present

## 2015-03-04 MED ORDER — SERTRALINE HCL 100 MG PO TABS: 100.0000 mg | ORAL_TABLET | Freq: Every day | ORAL | Status: DC

## 2015-03-04 NOTE — Progress Notes (Signed)
CC: Sierra Morrison is a 34 y.o. female is here for 6 month f/u   Subjective: HPI:  Follow-up anxiety: Taking Zoloft 100 mg on a daily basis without any noted side effects. Denies thoughts of wanting to harm self or others, depression, paranoia, hallucinations. She believes is helping greatly with reducing anxiety. Without this medication she has difficulty ignoring nervousness, excessive worrying and job responsibilities feel overwhelming. She's been on this medication off and on for almost 2 decades. Whenever she stops taking it within 2 or 3 months symptoms return and get to a degree that interfere with quality of life. Denies any other mental disturbance.     Review Of Systems Outlined In HPI  Past Medical History  Diagnosis Date  . Anxiety   . Depression   . Abnormal Pap smear of cervix     Past Surgical History  Procedure Laterality Date  . Wisdom tooth extraction    . Mole removal  2003    spit nevi   Family History  Problem Relation Age of Onset  . Cancer Mother     breast  . Cancer Mother     skin  . Cancer Maternal Grandmother     breast  . Cancer Maternal Aunt     breast    History   Social History  . Marital Status: Married    Spouse Name: N/A  . Number of Children: N/A  . Years of Education: N/A   Occupational History  . admission coordinator    Social History Main Topics  . Smoking status: Never Smoker   . Smokeless tobacco: Never Used  . Alcohol Use: 0.6 oz/week    1 Glasses of wine per week     Comment: occassionally  . Drug Use: No     Comment: While in college  . Sexual Activity:    Partners: Male    Pharmacist, hospitalBirth Control/ Protection: None   Other Topics Concern  . Not on file   Social History Narrative     Objective: BP 102/66 mmHg  Pulse 67  Wt 117 lb (53.071 kg)  LMP 01/14/2015  Vital signs reviewed. General: Alert and Oriented, No Acute Distress HEENT: Pupils equal, round, reactive to light. Conjunctivae clear.  External ears  unremarkable.  Moist mucous membranes. Lungs: Clear and comfortable work of breathing, speaking in full sentences without accessory muscle use. Cardiac: Regular rate and rhythm.  Neuro: CN II-XII grossly intact, gait normal. Extremities: No peripheral edema.  Strong peripheral pulses.  Mental Status: No depression, anxiety, nor agitation. Logical though process. Skin: Warm and dry. Assessment & Plan: Sierra HireKristen was seen today for 6 month f/u.  Diagnoses and all orders for this visit:  Generalized anxiety disorder Orders: -     sertraline (ZOLOFT) 100 MG tablet; Take 1 tablet (100 mg total) by mouth daily.   Anxiety: Controlled with Zoloft, continue daily regimen with suggested follow-up in 9 months given that she's been on this medication for so long and incredibly stable on this medication.  Return in about 9 months (around 12/05/2015).

## 2015-03-24 ENCOUNTER — Encounter (HOSPITAL_BASED_OUTPATIENT_CLINIC_OR_DEPARTMENT_OTHER): Payer: Self-pay | Admitting: *Deleted

## 2015-03-24 NOTE — Progress Notes (Signed)
NPO AFTER MN WITH EXCEPTION CLEAR LIQUIDS UNTIL 0700 (NO CREAM/ MILK PRODUCTS) .  ARRIVE AT 1115. NEEDS HG AND URINE PREG.  WILL TAKE AM MEDS W/ SIPS OF WATER.

## 2015-03-25 ENCOUNTER — Encounter (HOSPITAL_BASED_OUTPATIENT_CLINIC_OR_DEPARTMENT_OTHER): Payer: Self-pay | Admitting: *Deleted

## 2015-03-25 ENCOUNTER — Ambulatory Visit (HOSPITAL_BASED_OUTPATIENT_CLINIC_OR_DEPARTMENT_OTHER): Payer: Commercial Managed Care - PPO | Admitting: Anesthesiology

## 2015-03-25 ENCOUNTER — Ambulatory Visit (HOSPITAL_BASED_OUTPATIENT_CLINIC_OR_DEPARTMENT_OTHER)
Admission: RE | Admit: 2015-03-25 | Discharge: 2015-03-25 | Disposition: A | Discharge status: Home / Self Care | Payer: Commercial Managed Care - PPO | Source: Ambulatory Visit | Attending: Obstetrics and Gynecology | Admitting: Obstetrics and Gynecology

## 2015-03-25 ENCOUNTER — Encounter (HOSPITAL_BASED_OUTPATIENT_CLINIC_OR_DEPARTMENT_OTHER)
Admission: RE | Disposition: A | Discharge status: Home / Self Care | Payer: Self-pay | Source: Ambulatory Visit | Attending: Obstetrics and Gynecology

## 2015-03-25 DIAGNOSIS — F329 Major depressive disorder, single episode, unspecified: Secondary | ICD-10-CM | POA: Diagnosis not present

## 2015-03-25 DIAGNOSIS — F419 Anxiety disorder, unspecified: Secondary | ICD-10-CM | POA: Insufficient documentation

## 2015-03-25 DIAGNOSIS — N803 Endometriosis of pelvic peritoneum: Secondary | ICD-10-CM | POA: Diagnosis not present

## 2015-03-25 DIAGNOSIS — N8320 Unspecified ovarian cysts: Secondary | ICD-10-CM | POA: Insufficient documentation

## 2015-03-25 DIAGNOSIS — N736 Female pelvic peritoneal adhesions (postinfective): Secondary | ICD-10-CM | POA: Diagnosis not present

## 2015-03-25 DIAGNOSIS — N801 Endometriosis of ovary: Secondary | ICD-10-CM | POA: Diagnosis not present

## 2015-03-25 DIAGNOSIS — N979 Female infertility, unspecified: Secondary | ICD-10-CM | POA: Insufficient documentation

## 2015-03-25 DIAGNOSIS — N808 Other endometriosis: Secondary | ICD-10-CM | POA: Insufficient documentation

## 2015-03-25 HISTORY — DX: Unspecified ovarian cyst, left side: N83.202

## 2015-03-25 HISTORY — DX: Personal history of other diseases of the female genital tract: Z87.42

## 2015-03-25 HISTORY — DX: Female infertility, unspecified: N97.9

## 2015-03-25 HISTORY — DX: Presence of spectacles and contact lenses: Z97.3

## 2015-03-25 HISTORY — DX: Endometriosis, unspecified: N80.9

## 2015-03-25 HISTORY — PX: LAPAROSCOPIC OVARIAN CYSTECTOMY: SHX6248

## 2015-03-25 LAB — TYPE AND SCREEN
ABO/RH(D): O POS
Antibody Screen: NEGATIVE

## 2015-03-25 LAB — POCT PREGNANCY, URINE: PREG TEST UR: NEGATIVE

## 2015-03-25 LAB — ABO/RH: ABO/RH(D): O POS

## 2015-03-25 SURGERY — EXCISION, CYST, OVARY, LAPAROSCOPIC
Anesthesia: General | Site: Abdomen | Laterality: Left

## 2015-03-25 MED ORDER — MIDAZOLAM HCL 5 MG/5ML IJ SOLN
INTRAMUSCULAR | Status: DC | PRN
  Administered 2015-03-25: 2 mg via INTRAVENOUS

## 2015-03-25 MED ORDER — NEOSTIGMINE METHYLSULFATE 10 MG/10ML IV SOLN
INTRAVENOUS | Status: DC | PRN
  Administered 2015-03-25: 3 mg via INTRAVENOUS

## 2015-03-25 MED ORDER — MEPERIDINE HCL 25 MG/ML IJ SOLN
6.2500 mg | INTRAMUSCULAR | Status: DC | PRN
  Filled 2015-03-25: qty 1

## 2015-03-25 MED ORDER — ONDANSETRON HCL 4 MG PO TABS: 4.0000 mg | ORAL_TABLET | Freq: Three times a day (TID) | ORAL | Status: DC | PRN

## 2015-03-25 MED ORDER — BUPIVACAINE-EPINEPHRINE 0.25% -1:200000 IJ SOLN
INTRAMUSCULAR | Status: DC | PRN
Start: 2015-03-25 — End: 2015-03-25
  Administered 2015-03-25: 7 mL

## 2015-03-25 MED ORDER — DEXAMETHASONE SODIUM PHOSPHATE 4 MG/ML IJ SOLN
INTRAMUSCULAR | Status: DC | PRN
  Administered 2015-03-25: 10 mg via INTRAVENOUS

## 2015-03-25 MED ORDER — PROMETHAZINE HCL 25 MG/ML IJ SOLN
6.2500 mg | INTRAMUSCULAR | Status: DC | PRN
  Filled 2015-03-25: qty 1

## 2015-03-25 MED ORDER — CEFAZOLIN SODIUM-DEXTROSE 2-3 GM-% IV SOLR
INTRAVENOUS | Status: AC
  Filled 2015-03-25: qty 50

## 2015-03-25 MED ORDER — LACTATED RINGERS IV SOLN
INTRAVENOUS | Status: DC
  Administered 2015-03-25 (×2): via INTRAVENOUS
  Filled 2015-03-25: qty 1000

## 2015-03-25 MED ORDER — OXYCODONE HCL 5 MG PO TABS
2.5000 mg | ORAL_TABLET | Freq: Once | ORAL | Status: AC
  Administered 2015-03-25: 2.5 mg via ORAL
  Filled 2015-03-25: qty 1

## 2015-03-25 MED ORDER — FENTANYL CITRATE (PF) 100 MCG/2ML IJ SOLN
INTRAMUSCULAR | Status: DC | PRN
  Administered 2015-03-25: 25 ug via INTRAVENOUS
  Administered 2015-03-25: 50 ug via INTRAVENOUS
  Administered 2015-03-25: 25 ug via INTRAVENOUS
  Administered 2015-03-25: 50 ug via INTRAVENOUS
  Administered 2015-03-25: 25 ug via INTRAVENOUS
  Administered 2015-03-25 (×2): 50 ug via INTRAVENOUS
  Administered 2015-03-25: 25 ug via INTRAVENOUS

## 2015-03-25 MED ORDER — KETOROLAC TROMETHAMINE 30 MG/ML IJ SOLN
INTRAMUSCULAR | Status: DC | PRN
  Administered 2015-03-25: 30 mg via INTRAVENOUS

## 2015-03-25 MED ORDER — FENTANYL CITRATE (PF) 100 MCG/2ML IJ SOLN
INTRAMUSCULAR | Status: AC
  Filled 2015-03-25: qty 2

## 2015-03-25 MED ORDER — LACTATED RINGERS IR SOLN
Status: DC | PRN
  Administered 2015-03-25: 3000 mL

## 2015-03-25 MED ORDER — SUCCINYLCHOLINE CHLORIDE 20 MG/ML IJ SOLN
INTRAMUSCULAR | Status: DC | PRN
  Administered 2015-03-25: 100 mg via INTRAVENOUS

## 2015-03-25 MED ORDER — CEFAZOLIN SODIUM-DEXTROSE 2-3 GM-% IV SOLR
2.0000 g | INTRAVENOUS | Status: AC
  Administered 2015-03-25: 2 g via INTRAVENOUS
  Filled 2015-03-25: qty 50

## 2015-03-25 MED ORDER — ACETAMINOPHEN 10 MG/ML IV SOLN
INTRAVENOUS | Status: DC | PRN
  Administered 2015-03-25: 1000 mg via INTRAVENOUS

## 2015-03-25 MED ORDER — SODIUM CHLORIDE 0.9 % IR SOLN
Status: DC | PRN
  Administered 2015-03-25: 500 mL

## 2015-03-25 MED ORDER — PROPOFOL 10 MG/ML IV BOLUS
INTRAVENOUS | Status: DC | PRN
  Administered 2015-03-25: 50 mg via INTRAVENOUS
  Administered 2015-03-25: 160 mg via INTRAVENOUS

## 2015-03-25 MED ORDER — MIDAZOLAM HCL 2 MG/2ML IJ SOLN
INTRAMUSCULAR | Status: AC
  Filled 2015-03-25: qty 2

## 2015-03-25 MED ORDER — OXYCODONE-ACETAMINOPHEN 5-325 MG PO TABS
ORAL_TABLET | ORAL | Status: AC
  Filled 2015-03-25: qty 1

## 2015-03-25 MED ORDER — ROCURONIUM BROMIDE 100 MG/10ML IV SOLN
INTRAVENOUS | Status: DC | PRN
  Administered 2015-03-25: 25 mg via INTRAVENOUS
  Administered 2015-03-25 (×2): 5 mg via INTRAVENOUS

## 2015-03-25 MED ORDER — LIDOCAINE HCL (CARDIAC) 20 MG/ML IV SOLN
INTRAVENOUS | Status: DC | PRN
  Administered 2015-03-25: 70 mg via INTRAVENOUS

## 2015-03-25 MED ORDER — OXYCODONE-ACETAMINOPHEN 7.5-325 MG PO TABS: 1.0000 | ORAL_TABLET | ORAL | Status: DC | PRN

## 2015-03-25 MED ORDER — OXYCODONE HCL 5 MG PO TABS
ORAL_TABLET | ORAL | Status: AC
  Filled 2015-03-25: qty 1

## 2015-03-25 MED ORDER — LACTATED RINGERS IV SOLN
INTRAVENOUS | Status: DC
  Administered 2015-03-25: 15:00:00 via INTRAVENOUS
  Filled 2015-03-25: qty 1000

## 2015-03-25 MED ORDER — FENTANYL CITRATE (PF) 100 MCG/2ML IJ SOLN
INTRAMUSCULAR | Status: AC
  Filled 2015-03-25: qty 6

## 2015-03-25 MED ORDER — METHYLENE BLUE 1 % INJ SOLN
INTRAMUSCULAR | Status: DC | PRN
  Administered 2015-03-25: 1 mL via SUBMUCOSAL

## 2015-03-25 MED ORDER — ONDANSETRON HCL 4 MG/2ML IJ SOLN
INTRAMUSCULAR | Status: DC | PRN
Start: 2015-03-25 — End: 2015-03-25
  Administered 2015-03-25: 4 mg via INTRAVENOUS

## 2015-03-25 MED ORDER — GLYCOPYRROLATE 0.2 MG/ML IJ SOLN
INTRAMUSCULAR | Status: DC | PRN
  Administered 2015-03-25: 0.4 mg via INTRAVENOUS

## 2015-03-25 MED ORDER — FENTANYL CITRATE (PF) 100 MCG/2ML IJ SOLN
25.0000 ug | INTRAMUSCULAR | Status: DC | PRN
  Administered 2015-03-25 (×3): 50 ug via INTRAVENOUS
  Filled 2015-03-25: qty 1

## 2015-03-25 MED ORDER — OXYCODONE-ACETAMINOPHEN 5-325 MG PO TABS
1.0000 | ORAL_TABLET | ORAL | Status: DC | PRN
  Administered 2015-03-25: 1 via ORAL
  Filled 2015-03-25: qty 1

## 2015-03-25 SURGICAL SUPPLY — 49 items
BEAM PATH, ROBOTIC FIBER ×6 IMPLANT
BLADE SURG 11 STRL SS (BLADE) ×3 IMPLANT
BLADE SURG 15 STRL LF DISP TIS (BLADE) ×1 IMPLANT
BLADE SURG 15 STRL SS (BLADE) ×2
CATH FOLEY 2WAY SLVR  5CC 14FR (CATHETERS) ×2
CATH FOLEY 2WAY SLVR 5CC 14FR (CATHETERS) ×1 IMPLANT
CATH ROBINSON RED A/P 16FR (CATHETERS) ×3 IMPLANT
CLOSURE WOUND 1/2 X4 (GAUZE/BANDAGES/DRESSINGS) ×1
COVER MAYO STAND STRL (DRAPES) ×3 IMPLANT
DRAPE UNDERBUTTOCKS STRL (DRAPE) ×3 IMPLANT
ELECT REM PT RETURN 9FT ADLT (ELECTROSURGICAL) ×3
ELECTRODE REM PT RTRN 9FT ADLT (ELECTROSURGICAL) ×1 IMPLANT
FILTER SMOKE EVAC LAPAROSHD (FILTER) ×3 IMPLANT
GLOVE BIOGEL PI IND STRL 7.5 (GLOVE) ×2 IMPLANT
GLOVE BIOGEL PI IND STRL 8.5 (GLOVE) ×1 IMPLANT
GLOVE BIOGEL PI INDICATOR 7.5 (GLOVE) ×4
GLOVE BIOGEL PI INDICATOR 8.5 (GLOVE) ×2
GLOVE SURG SS PI 7.5 STRL IVOR (GLOVE) ×3 IMPLANT
GOWN STRL REUS W/ TWL LRG LVL3 (GOWN DISPOSABLE) ×2 IMPLANT
GOWN STRL REUS W/TWL LRG LVL3 (GOWN DISPOSABLE) ×4
MANIPULATOR UTERINE 4.5 ZUMI (MISCELLANEOUS) ×3 IMPLANT
NEEDLE HYPO 25X1 1.5 SAFETY (NEEDLE) ×3 IMPLANT
NEEDLE INSUFFLATION 14GA 120MM (NEEDLE) ×3 IMPLANT
NS IRRIG 500ML POUR BTL (IV SOLUTION) ×3 IMPLANT
PACK BASIN DAY SURGERY FS (CUSTOM PROCEDURE TRAY) ×3 IMPLANT
PACK LAPAROSCOPY II (CUSTOM PROCEDURE TRAY) ×3 IMPLANT
PAD OB MATERNITY 4.3X12.25 (PERSONAL CARE ITEMS) ×3 IMPLANT
PADDING ION DISPOSABLE (MISCELLANEOUS) ×3 IMPLANT
PENCIL BUTTON HOLSTER BLD 10FT (ELECTRODE) ×3 IMPLANT
SEPRAFILM MEMBRANE 5X6 (MISCELLANEOUS) ×6 IMPLANT
SET IRRIG TUBING LAPAROSCOPIC (IRRIGATION / IRRIGATOR) ×3 IMPLANT
SOLUTION ANTI FOG 6CC (MISCELLANEOUS) ×3 IMPLANT
STOPCOCK 4 WAY LG BORE MALE ST (IV SETS) ×3 IMPLANT
STRIP CLOSURE SKIN 1/2X4 (GAUZE/BANDAGES/DRESSINGS) ×2 IMPLANT
SUT MNCRL AB 4-0 PS2 18 (SUTURE) ×3 IMPLANT
SUT VIC AB 2-0 CT1 (SUTURE) ×3 IMPLANT
SUT VICRYL 6 0 RB 1 (SUTURE) ×3 IMPLANT
SYR 30ML LL (SYRINGE) ×3 IMPLANT
SYR 50ML LL SCALE MARK (SYRINGE) ×3 IMPLANT
SYR 5ML LL (SYRINGE) ×3 IMPLANT
SYR CONTROL 10ML LL (SYRINGE) ×9 IMPLANT
SYRINGE 10CC LL (SYRINGE) ×3 IMPLANT
SYS LAPSCP GELPORT 120MM (MISCELLANEOUS) ×3
SYSTEM LAPSCP GELPORT 120MM (MISCELLANEOUS) ×1 IMPLANT
TOWEL OR 17X24 6PK STRL BLUE (TOWEL DISPOSABLE) ×6 IMPLANT
TRAY DSU PREP LF (CUSTOM PROCEDURE TRAY) ×3 IMPLANT
TROCAR OPTI TIP 5M 100M (ENDOMECHANICALS) ×6 IMPLANT
TUBING INSUFFLATION 10FT LAP (TUBING) ×3 IMPLANT
WARMER LAPAROSCOPE (MISCELLANEOUS) ×3 IMPLANT

## 2015-03-25 NOTE — Anesthesia Procedure Notes (Signed)
Procedure Name: Intubation Date/Time: 03/25/2015 12:34 PM Performed by: Tyrone NineSAUVE, Terisha Losasso F Pre-anesthesia Checklist: Patient being monitored, Suction available, Emergency Drugs available, Timeout performed and Patient identified Patient Re-evaluated:Patient Re-evaluated prior to inductionOxygen Delivery Method: Circle system utilized Preoxygenation: Pre-oxygenation with 100% oxygen Intubation Type: IV induction Laryngoscope Size: Mac and 3 Grade View: Grade I Tube type: Oral Number of attempts: 1 Airway Equipment and Method: Stylet and Bite block Placement Confirmation: ETT inserted through vocal cords under direct vision,  positive ETCO2 and breath sounds checked- equal and bilateral Secured at: 20 cm Tube secured with: Tape Dental Injury: Teeth and Oropharynx as per pre-operative assessment

## 2015-03-25 NOTE — Anesthesia Preprocedure Evaluation (Addendum)
Anesthesia Evaluation  Patient identified by MRN, date of birth, ID band Patient awake    Reviewed: Allergy & Precautions, NPO status , Patient's Chart, lab work & pertinent test results  Airway Mallampati: II  TM Distance: >3 FB Neck ROM: Full    Dental no notable dental hx. (+) Teeth Intact, Dental Advisory Given   Pulmonary neg pulmonary ROS,  breath sounds clear to auscultation  Pulmonary exam normal       Cardiovascular Exercise Tolerance: Good negative cardio ROS Normal cardiovascular examRhythm:Regular Rate:Normal     Neuro/Psych negative neurological ROS  negative psych ROS   GI/Hepatic negative GI ROS, Neg liver ROS,   Endo/Other  negative endocrine ROS  Renal/GU negative Renal ROS  negative genitourinary   Musculoskeletal negative musculoskeletal ROS (+)   Abdominal   Peds negative pediatric ROS (+)  Hematology negative hematology ROS (+)   Anesthesia Other Findings   Reproductive/Obstetrics negative OB ROS                           Anesthesia Physical Anesthesia Plan  ASA: I  Anesthesia Plan: General   Post-op Pain Management:    Induction: Intravenous  Airway Management Planned: LMA  Additional Equipment:   Intra-op Plan:   Post-operative Plan: Extubation in OR  Informed Consent: I have reviewed the patients History and Physical, chart, labs and discussed the procedure including the risks, benefits and alternatives for the proposed anesthesia with the patient or authorized representative who has indicated his/her understanding and acceptance.   Dental advisory given  Plan Discussed with: CRNA  Anesthesia Plan Comments:         Anesthesia Quick Evaluation

## 2015-03-25 NOTE — Discharge Instructions (Addendum)
Post Anesthesia Home Care Instructions  Activity: Get plenty of rest for the remainder of the day. A responsible adult should stay with you for 24 hours following the procedure.  For the next 24 hours, DO NOT: -Drive a car -Advertising copywriter -Drink alcoholic beverages -Take any medication unless instructed by your physician -Make any legal decisions or sign important papers.  Meals: Start with liquid foods such as gelatin or soup. Progress to regular foods as tolerated. Avoid greasy, spicy, heavy foods. If nausea and/or vomiting occur, drink only clear liquids until the nausea and/or vomiting subsides. Call your physician if vomiting continues.  Special Instructions/Symptoms: Your throat may feel dry or sore from the anesthesia or the breathing tube placed in your throat during surgery. If this causes discomfort, gargle with warm salt water. The discomfort should disappear within 24 hours.  If you had a scopolamine patch placed behind your ear for the management of post- operative nausea and/or vomiting:  1. The medication in the patch is effective for 72 hours, after which it should be removed.  Wrap patch in a tissue and discard in the trash. Wash hands thoroughly with soap and water. 2. You may remove the patch earlier than 72 hours if you experience unpleasant side effects which may include dry mouth, dizziness or visual disturbances. 3. Avoid touching the patch. Wash your hands with soap and water after contact with the patch.   Call your surgeon if you experience:   1.  Fever over 101.0. 2.  Inability to urinate. 3.  Nausea and/or vomiting. 4.  Extreme swelling or bruising at the surgical site. 5.  Continued bleeding from the incision. 6.  Increased pain, redness or drainage from the incision. 7.  Problems related to your pain medication.  Diagnostic Laparoscopy Laparoscopy is a surgical procedure. It is used to diagnose and treat diseases inside the belly (abdomen). It is  usually a brief, common, and relatively simple procedure. The laparoscopeis a thin, lighted, pencil-sized instrument. It is like a telescope. It is inserted into your abdomen through a small cut (incision). Your caregiver can look at the organs inside your body through this instrument. He or she can see if there is anything abnormal. Laparoscopy can be done either in a hospital or outpatient clinic. You may be given a mild sedative to help you relax before the procedure. Once in the operating room, you will be given a drug to make you sleep (general anesthesia). Laparoscopy usually lasts less than 1 hour. After the procedure, you will be monitored in a recovery area until you are stable and doing well. Once you are home, it will take 2 to 3 days to fully recover. RISKS AND COMPLICATIONS  Laparoscopy has relatively few risks. Your caregiver will discuss the risks with you before the procedure. Some problems that can occur include:  Infection.  Bleeding.  Damage to other organs.  Anesthetic side effects. PROCEDURE Once you receive anesthesia, your surgeon inflates the abdomen with a harmless gas (carbon dioxide). This makes the organs easier to see. The laparoscope is inserted into the abdomen through a small incision. This allows your surgeon to see into the abdomen. Other small instruments are also inserted into the abdomen through other small openings. Many surgeons attach a video camera to the laparoscope to enlarge the view. During a diagnostic laparoscopy, the surgeon may be looking for inflammation, infection, or cancer. Your surgeon may take tissue samples(biopsies). The samples are sent to a specialist in looking at cells and  tissue samples (pathologist). The pathologist examines them under a microscope. Biopsies can help to diagnose or confirm a disease. AFTER THE PROCEDURE   The gas is released from inside the abdomen.  The incisions are closed with stitches (sutures). Because these  incisions are small (usually less than 1/2 inch), there is usually minimal discomfort after the procedure. There may be some mild discomfort in the throat. This is from the tube placed in the throat while you were sleeping. You may have some mild abdominal discomfort. There may also be discomfort from the instrument placement incisions in the abdomen.  The recovery time is shortened as long as there are no complications.  You will rest in a recovery room until stable and doing well. As long as there are no complications, you may be allowed to go home. FINDING OUT THE RESULTS OF YOUR TEST Not all test results are available during your visit. If your test results are not back during the visit, make an appointment with your caregiver to find out the results. Do not assume everything is normal if you have not heard from your caregiver or the medical facility. It is important for you to follow up on all of your test results. HOME CARE INSTRUCTIONS   Take all medicines as directed.  Only take over-the-counter or prescription medicines for pain, discomfort, or fever as directed by your caregiver.  Resume daily activities as directed.  Showers are preferred over baths.  You may resume sexual activities in 1 week or as directed.  Do not drive while taking narcotics. SEEK MEDICAL CARE IF:   There is increasing abdominal pain.  There is new pain in the shoulders (shoulder strap areas).  You feel lightheaded or faint.  You have the chills.  You or your child has an oral temperature above 102 F (38.9 C).  There is pus-like (purulent) drainage from any of the wounds.  You are unable to pass gas or have a bowel movement.  You feel sick to your stomach (nauseous) or throw up (vomit). MAKE SURE YOU:   Understand these instructions.  Will watch your condition.  Will get help right away if you are not doing well or get worse. Document Released: 01/09/2001 Document Revised: 01/28/2013  Document Reviewed: 10/03/2007 [facility] Patient Information 2015 [place], [place]. This information is not intended to replace advice given to you by your health care provider. Make sure you discuss any questions you have with your health care provider. . Any problems and/or concerns

## 2015-03-25 NOTE — Op Note (Signed)
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 endotracheal  Complications: None  Estimated blood loss: <10 cc  Specimens: Anterior cul-de-sac biopsy, posterior cul-de-sac biopsy, left ovarian cyst wall to pathology.  Findings: On examination under anesthesia, external genitalia, Bartholin's, Skene's, urethra were normal. The vagina was normal. Cervix was deviated to the right and anteriorly and appeared normal. The uterine corpus was deviated to the right and anteriorly and normal size and mobile. There was a 7 cm posterior cul-de-sac mass at midline that was cystic and fixed. There were no posterior cul-de-sac induration or nodularity. The uterus sounded to 7.5 cm.  On laparoscopy, upper abdomen, liver surface and diaphragm surfaces were normal. Gallbladder was normal. Appendix was normal with a congenital adhesion between the cecum and the right lower quadrant abdominal wall. There were stellate fibrosis type lesions on the left side of anterior cul-de-sac and red polypoid lesions on the right side. There was also hemosiderin pigmentation in the vesicouterine flexion. All of these incisions were excised or ablated with CO2 laser. The uterus appeared normal except for posterior adhesions to the enlarged left ovary. Posterior cul-de-sac was completely taken up by the 7 cm left ovarian cystic enlargement with periovarian filmy adhesions anteriorly and posteriorly and laterally. After decompression of the cyst, these adhesions became more obvious and were lysed in their entirety.  The left ovary was approximately 50% covered with filmy adhesions. The left tube was normal proximally and distally with 5 out of 5 fimbria. It was patent to chromotubation. The right fallopian tube was normal proximally and distally with 5 out of 5 fimbria. It was patent to chromotubation. There was a 1/2 cm paratubal cyst at its distal end which was deflated.   The right ovary was less than one third adherent to the pelvic sidewall with filmy adhesions. It had brown lesions of endometriosis at its hilum. There was also superficial hemosiderin pigmentation type lesions on the ovary. All of the implants were ablated using the electrosurgical needle on cutting current. Overall the fallopian tubes were not affected with endometriosis and were free of adhesions.                          Description of the procedure: The patient was placed in dorsal supine position and general endotracheal anesthesia was given. 2 g of cefazolin were given intravenously for prophylaxis. Patient was placed in lithotomy position. She was prepped and draped inside manner. A Foley catheter was inserted into the bladder. A ZUMI catheter was placed into the uterine cavity, after dilating the cervix to 67 [place] with Hanks dilators. The surgeon was regloved and a surgical field was created on the abdomen.  After preemptive anesthesia of all surgical sites with 0.25% bupivacaine with 1 200,000 epinephrine, a supraumbilical transverse skin incision was made and a Verress needle was inserted. Its correct location was confirmed. A pneumoperitoneum was created with carbon dioxide.  5 mm laparoscope with a 30 lens was inserted and video laparoscopy was started . right  lower quadrant 5 mm  incision was made and ancillary trochar was  placed under direct visualization. In addition, we made a midline transverse suprapubic 3 cm skin incision,  and after dissection of the anatomic layers, entered the peritoneal cavity and placed a GelPort. We  sometimes use this port for laparoscopic approach and sometimes opened the ports and converted into a minilaparotomic approach. Above findings were noted.  Using Omni guide CO2 laser at a setting of 5 W continuous, we superficially ablated the hemosiderin deposits on the pelvic peritoneum. In addition the left side and right side anterior cul-de-sac lesions were circumferentially cut using a 12 W setting and these peritoneum was separated from the underlying areolar tissue and the biopsies were sent to pathology.  We used the same energy source to free up the periovarian adhesions on the enlarged left ovary and ablated the left ovarian fossa and right ovarian fossa fibrotic lesions of endometriosis. Next we made and 1 cm opening on the cyst wall and immediately started aspirating the endometriotic contents of this cyst with a suction tip. Then we grasped the deflated cyst wall with Babcocks inserted through the GelPort and bluntly and sharply freed up the left ovary from remaining adhesions.   the deflated left ovary was brought out through the GelPort incision and the cyst opening was enlarged with Metzenbaum scissors and the distinct cyst wall was easily separated by blunt and sharp dissection from the normal ovarian stroma and submitted to pathology. We then reconstructed the ovary using a pursestring suture at the equator of the cyst with 6-0 Vicryl suture in subcortical stitches. We used the same suture to approximate the wide opening of the left ovary caused by cystectomy, in a running suture manner. Good hemostasis was insured. We replaced the ovary and close a GelPort and worked on freeing up the right ovary laparoscopically. This was done with needle electrode. Using the same energy source we ablated the brown lesions at the hilum until normal subperitoneal tissue was reached. Chromotubation showed bilateral tubal patency. Remaining loose adhesion tissue in the posterior cul-de-sac and on the  posterior aspect of the uterus was grasped and sharply excised and submitted to pathology. Pelvis was copiously irrigated with lactated Ringer's and aspirated. Hemostasis was again checked. A slurry of 2 sheets of Seprafilm in 60 mL of lactated Ringer's was prepared and instilled into the pelvis as a means of reducing probability of adhesion re-formation. The trochars were removed and the gas was allowed to escape. Instrument and lap pad count were correct. The fascia of the 3 cm GelPort incision was approximated with 2-0 Vicryl continuous suture. Subcutaneous hemostasis was checked. The skin incisions were approximated with 4-0 Monocryl in subcuticular stitches. Steri-Strips were applied to skin.  Patient tolerated the procedure well and was transferred to recovery room in satisfactory condition. Patient will stay on suppression of endometriosis with Femara 2.5 mg and norethindrone acetate 2.5 mg twice a day, until she comes back for postop check.   Fermin SchwabYALCINKAYA,Tarius Stangelo

## 2015-03-25 NOTE — Transfer of Care (Signed)
Immediate Anesthesia Transfer of Care Note  Patient: Sierra SimmerKristen Morrison  Procedure(s) Performed: Procedure(s): LAPAROSCOPIC GELPORT ASSISTED LEFT OVARIAN CYSTECTOMY/EXCISION AND ABLATION OF ENDOMETROSIS/LYSIS OF ADHESIONS/CHROMOTUBATION (Left)  Patient Location: PACU  Anesthesia Type:General  Level of Consciousness: awake, alert , oriented and patient cooperative  Airway & Oxygen Therapy: Patient Spontanous Breathing and Patient connected to nasal cannula oxygen  Post-op Assessment: Report given to RN and Post -op Vital signs reviewed and stable  Post vital signs: Reviewed and stable  Last Vitals:  Filed Vitals:   03/25/15 1109  BP: 108/59  Pulse: 73  Temp: 37.2 C  Resp: 14    Complications: No apparent anesthesia complications

## 2015-03-25 NOTE — H&P (Signed)
Sierra Morrison is a 34 y.o. female , originally referred to me by Dr. Penne Lash, for endometriosis and infertility. She was diagnosed with stage 3-4 endometriosis and a 7cm L endometrioma.   Pertinent Gynecological History: Menses: normal Bleeding: normal Contraception: none DES exposure: denies Blood transfusions: none Sexually transmitted diseases: no past history Previous GYN Procedures: none  Last mammogram: n/a Last pap: normal  OB History: G 0   Menstrual History: Menarche age: 37 No LMP recorded.    Past Medical History  Diagnosis Date  . Anxiety   . Depression   . History of abnormal cervical Pap smear   . Endometriosis   . Left ovarian cyst   . Infertility, female   . Wears glasses                     Past Surgical History  Procedure Laterality Date  . Wisdom tooth extraction  age 34  . Mole removal  2003    spit nevi             Family History  Problem Relation Age of Onset  . Cancer Mother     breast  . Cancer Mother     skin  . Cancer Maternal Grandmother     breast  . Cancer Maternal Aunt     breast   No hereditary disease.  No cancer of breast, ovary, uterus. No cutaneous leiomyomatosis or renal cell carcinoma.  History   Social History  . Marital Status: Married    Spouse Name: N/A  . Number of Children: N/A  . Years of Education: N/A   Occupational History  . admission coordinator    Social History Main Topics  . Smoking status: Never Smoker   . Smokeless tobacco: Never Used  . Alcohol Use: 0.6 oz/week    1 Glasses of wine per week     Comment: occassionally  . Drug Use: No  . Sexual Activity:    Partners: Male    Birth Control/ Protection: None   Other Topics Concern  . Not on file   Social History Narrative    No Known Allergies  No current facility-administered medications on file prior to encounter.   Current Outpatient Prescriptions on File Prior to Encounter  Medication Sig Dispense Refill  . letrozole  (FEMARA) 2.5 MG tablet Take one tablet daily days 3-7 of cycle (Patient taking differently: Take 2.5 mg by mouth every morning. Take one tablet daily days 3-7 of cycle) 5 tablet 2  . Prenatal MV-Min-Fe Fum-FA-DHA (PRENATAL 1 PO) Take 1 tablet by mouth daily.     . sertraline (ZOLOFT) 100 MG tablet Take 1 tablet (100 mg total) by mouth daily. (Patient taking differently: Take 100 mg by mouth every morning. ) 90 tablet 2     Review of Systems  Constitutional: Negative.   HENT: Negative.   Eyes: Negative.   Respiratory: Negative.   Cardiovascular: Negative.   Gastrointestinal: Negative.   Genitourinary: Negative.   Musculoskeletal: Negative.   Skin: Negative.   Neurological: Negative.   Endo/Heme/Allergies: Negative.   Psychiatric/Behavioral: Negative.      Physical Exam  Ht  (1.575 m)  Wt 52.617 kg (116 lb)  BMI 21.21 kg/m2  LMP 03/10/2015 (Exact Date) Constitutional: She is oriented to person, place, and time. She appears well-developed and well-nourished.  HENT:  Head: Normocephalic and atraumatic.  Nose: Nose normal.  Mouth/Throat: Oropharynx is clear and moist. No oropharyngeal exudate.  Eyes: Conjunctivae normal and EOM  are normal. Pupils are equal, round, and reactive to light. No scleral icterus.  Neck: Normal range of motion. Neck supple. No tracheal deviation present. No thyromegaly present.  Cardiovascular: Normal rate.   Respiratory: Effort normal and breath sounds normal.  GI: Soft. Bowel sounds are normal. She exhibits no distension and no mass. There is no tenderness.  Lymphadenopathy:    She has no cervical adenopathy.  Neurological: She is alert and oriented to person, place, and time. She has normal reflexes.  Skin: Skin is warm.  Psychiatric: She has a normal mood and affect. Her behavior is normal. Judgment and thought content normal.       Assessment/Plan:  I discussed with the patient the implications of severe endometriosis. I offered patient  laparoscopy and conservative treatment of stage III-IV followed by IVF/ET.

## 2015-03-26 LAB — POCT HEMOGLOBIN-HEMACUE: Hemoglobin: 12.6 g/dL (ref 12.0–15.0)

## 2015-03-26 NOTE — Anesthesia Postprocedure Evaluation (Signed)
  Anesthesia Post-op Note  Patient: Buyer, retail  Procedure(s) Performed: Procedure(s): LAPAROSCOPIC GELPORT ASSISTED LEFT OVARIAN CYSTECTOMY/EXCISION AND ABLATION OF ENDOMETROSIS/LYSIS OF ADHESIONS/CHROMOTUBATION (Left)  Patient Location: PACU  Anesthesia Type:General  Level of Consciousness: awake  Airway and Oxygen Therapy: Patient Spontanous Breathing  Post-op Pain: mild  Post-op Assessment: Post-op Vital signs reviewed, Patient's Cardiovascular Status Stable, Respiratory Function Stable, Patent Airway, No signs of Nausea or vomiting and Pain level controlled              Post-op Vital Signs: Reviewed and stable  Last Vitals:  Filed Vitals:   03/25/15 1748  BP: 102/64  Pulse: 80  Temp: 37 C  Resp: 16    Complications: No apparent anesthesia complications

## 2015-03-31 ENCOUNTER — Encounter (HOSPITAL_BASED_OUTPATIENT_CLINIC_OR_DEPARTMENT_OTHER): Payer: Self-pay | Admitting: Obstetrics and Gynecology

## 2015-05-18 ENCOUNTER — Encounter: Payer: Self-pay | Admitting: *Deleted

## 2015-10-18 NOTE — L&D Delivery Note (Addendum)
Delivery Note At 9:32 AM a viable female was delivered via Vaginal, Spontaneous Delivery (Presentation:ROA ; vertex ).  APGAR: 9, 9; weight pending .   Placenta status: delivered intact with gently traction.  Cord:  3 vessel, no velamentous cord noted with the following complications: none.    Anesthesia:  epidural Episiotomy: None Lacerations: 3rd degree partial Suture Repair: 3.0 vicryl Est. Blood Loss (mL): 150  Mom to postpartum.  Baby to Couplet care / Skin to Skin.  Ernestina Pennaicholas Schenk 08/06/2016, 10:37 AM   Attestation of Attending Supervision of Obstetric Fellow: Evaluation and management procedures were performed by the Obstetric Fellow under my supervision and collaboration.  I have reviewed the Obstetric Fellow's note and chart, and I agree with the management and plan.  Jaynie CollinsUGONNA  Darlinda Bellows, MD, FACOG Attending Obstetrician & Gynecologist Faculty Practice, [facility] [place]

## 2015-10-18 NOTE — L&D Delivery Note (Deleted)
Patient complete and pushing. SVD of viable female infant over intact perineum. Nuchal cord not present.  Infant delivered to mom's abdomen. Delayed cord clamping x 1 minute. Cord clamped x 2, cut. Spontaneous cry heard.   Cord blood obtained. Placenta delivered spontaneously and intact. LUS cleared of clot. Fundus firm on exam, pitocin running.  Lacerations: 2 x periurethral 1st degree lacerations that are not bleeding Suture: EBL: 150 Anesthesia: None  Apgars: 9/9 Weight: pending, skin to skin  Instrument and sponge count x2 correct.   Deniece ReeJosue D Josselyn Harkins, MD 08/06/2016 10:39 AM

## 2015-10-22 LAB — OB RESULTS CONSOLE GC/CHLAMYDIA: CHLAMYDIA, DNA PROBE: NEGATIVE

## 2015-12-07 ENCOUNTER — Ambulatory Visit: Payer: Commercial Managed Care - PPO | Admitting: Physician Assistant

## 2015-12-14 ENCOUNTER — Ambulatory Visit: Payer: Commercial Managed Care - PPO | Admitting: Physician Assistant

## 2015-12-29 ENCOUNTER — Ambulatory Visit: Payer: Commercial Managed Care - PPO | Admitting: Physician Assistant

## 2016-01-04 ENCOUNTER — Encounter: Payer: Self-pay | Admitting: Physician Assistant

## 2016-01-04 ENCOUNTER — Ambulatory Visit (INDEPENDENT_AMBULATORY_CARE_PROVIDER_SITE_OTHER): Payer: 59 | Admitting: Physician Assistant

## 2016-01-04 VITALS — BP 106/55 | HR 68 | Ht 62.0 in | Wt 124.0 lb

## 2016-01-04 DIAGNOSIS — F329 Major depressive disorder, single episode, unspecified: Secondary | ICD-10-CM

## 2016-01-04 DIAGNOSIS — F411 Generalized anxiety disorder: Secondary | ICD-10-CM | POA: Diagnosis not present

## 2016-01-04 DIAGNOSIS — Z331 Pregnant state, incidental: Secondary | ICD-10-CM | POA: Diagnosis not present

## 2016-01-04 DIAGNOSIS — Z3490 Encounter for supervision of normal pregnancy, unspecified, unspecified trimester: Secondary | ICD-10-CM | POA: Insufficient documentation

## 2016-01-04 DIAGNOSIS — F32A Depression, unspecified: Secondary | ICD-10-CM

## 2016-01-04 MED ORDER — SERTRALINE HCL 100 MG PO TABS: 100.0000 mg | ORAL_TABLET | Freq: Every day | ORAL | Status: DC

## 2016-01-04 NOTE — Progress Notes (Signed)
   Subjective:    Patient ID: Sierra SimmerKristen Morrison, female    DOB: 16-Oct-1981, 35 y.o.   MRN: [id number]  HPI Pt is a 35 yo female who presents to the clinic for follow up on depression and anxiety. Doing great on zoloft. Would like to decrease to 50mg  from 100mg  daily. She is 7 1/[redacted] weeks pregnant. She wants to be on least medication possible. She is undergoing IVF. She is very anxious about pregnancy. Last U/s at 4 weeks only showed yolk sac. She is having some mild morning sickness.    Review of Systems  All other systems reviewed and are negative.      Objective:   Physical Exam  Constitutional: She is oriented to person, place, and time. She appears well-developed and well-nourished.  HENT:  Head: Normocephalic and atraumatic.  Cardiovascular: Normal rate, regular rhythm and normal heart sounds.   Pulmonary/Chest: Effort normal and breath sounds normal. She has no wheezes.  Neurological: She is alert and oriented to person, place, and time.  Psychiatric: She has a normal mood and affect. Her behavior is normal.          Assessment & Plan:  GAD/Depression- PHQ-9 was 3. GAD-7 was 5. Looks great. Ok to cut in half and try 50mg  daily. Discussed to go back up if noticing worsening symptoms. Follow up in 1 year.    Pregnancy- discussed zoloft in pregnancy. Ok to decrease but if needed for mood need to go back to 100mg  so that your stress and anxiety does not affect baby as well. Continue on prenatal. Pt seeings fertility specialist for management.

## 2016-01-18 ENCOUNTER — Encounter: Payer: Self-pay | Admitting: Obstetrics & Gynecology

## 2016-01-18 ENCOUNTER — Ambulatory Visit (INDEPENDENT_AMBULATORY_CARE_PROVIDER_SITE_OTHER): Payer: 59 | Admitting: Obstetrics & Gynecology

## 2016-01-18 VITALS — HR 74 | Wt 126.0 lb

## 2016-01-18 DIAGNOSIS — Z124 Encounter for screening for malignant neoplasm of cervix: Secondary | ICD-10-CM | POA: Diagnosis not present

## 2016-01-18 DIAGNOSIS — F411 Generalized anxiety disorder: Secondary | ICD-10-CM

## 2016-01-18 DIAGNOSIS — Z1151 Encounter for screening for human papillomavirus (HPV): Secondary | ICD-10-CM | POA: Diagnosis not present

## 2016-01-18 DIAGNOSIS — Z3401 Encounter for supervision of normal first pregnancy, first trimester: Secondary | ICD-10-CM | POA: Diagnosis not present

## 2016-01-18 DIAGNOSIS — Z3400 Encounter for supervision of normal first pregnancy, unspecified trimester: Secondary | ICD-10-CM | POA: Insufficient documentation

## 2016-01-18 DIAGNOSIS — Z36 Encounter for antenatal screening of mother: Secondary | ICD-10-CM

## 2016-01-18 DIAGNOSIS — N809 Endometriosis, unspecified: Secondary | ICD-10-CM

## 2016-01-18 DIAGNOSIS — Z113 Encounter for screening for infections with a predominantly sexual mode of transmission: Secondary | ICD-10-CM | POA: Diagnosis not present

## 2016-01-18 DIAGNOSIS — Z3491 Encounter for supervision of normal pregnancy, unspecified, first trimester: Secondary | ICD-10-CM

## 2016-01-18 LAB — OB RESULTS CONSOLE GBS: GBS: POSITIVE

## 2016-01-18 NOTE — Progress Notes (Signed)
   Subjective:    Sierra Morrison is a G1P0000 6239w5d being seen today for her first obstetrical visit.  Her obstetrical history is significant for endometriosis, anxiety, IVF pregnancy. Patient does intend to breast feed. Pregnancy history fully reviewed.  Patient reports nausea and vomiting.  Filed Vitals:   01/18/16 1443  Pulse: 74  Weight: 126 lb (57.153 kg)    HISTORY: OB History  Gravida Para Term Preterm AB SAB TAB Ectopic Multiple Living  1 0 0 0 0 0 0 0 0 0     # Outcome Date GA Lbr Len/2nd Weight Sex Delivery Anes PTL Lv  1 Current              Past Medical History  Diagnosis Date  . Anxiety   . Depression   . History of abnormal cervical Pap smear   . Endometriosis   . Left ovarian cyst   . Infertility, female   . Wears glasses    Past Surgical History  Procedure Laterality Date  . Wisdom tooth extraction  age 35  . Mole removal  2003    spit nevi  . Nasal septum surgery  2007  . Laparoscopic ovarian cystectomy Left 03/25/2015    Procedure: LAPAROSCOPIC GELPORT ASSISTED LEFT OVARIAN CYSTECTOMY/EXCISION AND ABLATION OF ENDOMETROSIS/LYSIS OF ADHESIONS/CHROMOTUBATION;  Surgeon: Fermin Schwabamer Yalcinkaya, MD;  [facility] [place];  Service: Gynecology;  Laterality: Left;   Family History  Problem Relation Age of Onset  . Cancer Mother     breast  . Cancer Mother     skin  . Cancer Maternal Grandmother     breast  . Cancer Maternal Aunt     breast     Exam    Uterus:     Pelvic Exam:    Perineum: No Hemorrhoids   Vulva: normal   Vagina:  normal mucosa, vaginismus   pH: n/a   Cervix: friable with pap smear   Adnexa: normal adnexa   Bony Pelvis: average  System: Breast:  normal appearance, no masses or tenderness   Skin: normal coloration and turgor, no rashes    Neurologic: oriented, normal mood   Extremities: no deformities   HEENT sclera clear, anicteric, oropharynx clear, no lesions, neck supple with midline trachea and trachea  midline   Mouth/Teeth mucous membranes moist, pharynx normal without lesions and dental hygiene good   Neck supple and no masses   Cardiovascular: regular rate and rhythm   Respiratory:  appears well, vitals normal, no respiratory distress, acyanotic, normal RR, chest clear, no wheezing, crepitations, rhonchi, normal symmetric air entry   Abdomen: soft, non-tender; bowel sounds normal; no masses,  no organomegaly   Urinary: urethral meatus normal      Assessment:    Pregnancy: G1P0000 Patient Active Problem List   Diagnosis Date Noted  . Endometriosis 01/18/2016  . Pregnancy 01/04/2016  . 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 Anxiety 10/25/2011  . Depression 10/25/2011  . Family history of breast cancer in first degree relative 10/25/2011        Plan:      Initial labs drawn.  Prenatal vitamins.  Problem list reviewed and updated.  Genetic Screening discussed; pt had PGD and genetically  normal males;   Ultrasound discussed; fetal survey: requested--will schedule around 19 weeks  Will need AFP from 16-20 weeks  Pt declines medications for nausea at this time  Continue zoloft for anxiety.    Leeza Heiner H. 01/18/2016

## 2016-01-18 NOTE — Progress Notes (Signed)
Bedside U/S shows IUP with FHT of 173 BPM and CRL is 27.148mm  GA 369w4d

## 2016-01-19 LAB — OBSTETRIC PANEL
ANTIBODY SCREEN: NEGATIVE
BASOS PCT: 0 %
Basophils Absolute: 0 cells/uL (ref 0–200)
EOS PCT: 2 %
Eosinophils Absolute: 180 cells/uL (ref 15–500)
HEMATOCRIT: 38.9 % (ref 35.0–45.0)
HEMOGLOBIN: 12.9 g/dL (ref 11.7–15.5)
Hepatitis B Surface Ag: NEGATIVE
LYMPHS PCT: 25 %
Lymphs Abs: 2250 cells/uL (ref 850–3900)
MCH: 29.4 pg (ref 27.0–33.0)
MCHC: 33.2 g/dL (ref 32.0–36.0)
MCV: 88.6 fL (ref 80.0–100.0)
MONO ABS: 540 {cells}/uL (ref 200–950)
MONOS PCT: 6 %
MPV: 10.9 fL (ref 7.5–12.5)
Neutro Abs: 6030 cells/uL (ref 1500–7800)
Neutrophils Relative %: 67 %
Platelets: 282 10*3/uL (ref 140–400)
RBC: 4.39 MIL/uL (ref 3.80–5.10)
RDW: 14.5 % (ref 11.0–15.0)
RH TYPE: POSITIVE
Rubella: 2.34 Index — ABNORMAL HIGH (ref <0.90)
WBC: 9 10*3/uL (ref 3.8–10.8)

## 2016-01-19 LAB — CULTURE, URINE COMPREHENSIVE

## 2016-01-19 LAB — HIV ANTIBODY (ROUTINE TESTING W REFLEX): HIV 1&2 Ab, 4th Generation: NONREACTIVE

## 2016-01-20 LAB — CYTOLOGY - PAP

## 2016-01-22 ENCOUNTER — Encounter: Payer: Self-pay | Admitting: Obstetrics & Gynecology

## 2016-01-22 ENCOUNTER — Other Ambulatory Visit: Payer: Self-pay | Admitting: Obstetrics & Gynecology

## 2016-01-22 DIAGNOSIS — R8271 Bacteriuria: Secondary | ICD-10-CM | POA: Insufficient documentation

## 2016-01-22 MED ORDER — AMOXICILLIN 500 MG PO CAPS: 500.0000 mg | ORAL_CAPSULE | Freq: Three times a day (TID) | ORAL | Status: DC

## 2016-01-22 NOTE — Progress Notes (Signed)
20,000 units GBS in urine.  Treat now and in labor.

## 2016-01-25 NOTE — Progress Notes (Signed)
LM on voicemail of urine culture results and RX was swent to [facility] by Dr Penne LashLeggett to treat the GBS

## 2016-02-01 ENCOUNTER — Other Ambulatory Visit: Payer: 59

## 2016-02-01 DIAGNOSIS — Z3491 Encounter for supervision of normal pregnancy, unspecified, first trimester: Secondary | ICD-10-CM

## 2016-02-01 NOTE — Progress Notes (Unsigned)
Pt here for FHR check. She takes last progesterone in 2 days. Denies vaginal bleeding, discharge, abd pain/pressure. Currently no complaints.

## 2016-02-16 ENCOUNTER — Ambulatory Visit (INDEPENDENT_AMBULATORY_CARE_PROVIDER_SITE_OTHER): Payer: 59 | Admitting: Obstetrics & Gynecology

## 2016-02-16 VITALS — BP 117/80 | HR 79 | Wt 127.0 lb

## 2016-02-16 DIAGNOSIS — Z36 Encounter for antenatal screening for chromosomal anomalies: Secondary | ICD-10-CM

## 2016-02-16 DIAGNOSIS — Z3401 Encounter for supervision of normal first pregnancy, first trimester: Secondary | ICD-10-CM

## 2016-02-16 DIAGNOSIS — O219 Vomiting of pregnancy, unspecified: Secondary | ICD-10-CM

## 2016-02-16 MED ORDER — DOXYLAMINE-PYRIDOXINE 10-10 MG PO TBEC: DELAYED_RELEASE_TABLET | ORAL | Status: DC

## 2016-02-16 MED ORDER — PROMETHAZINE HCL 25 MG PO TABS: 25.0000 mg | ORAL_TABLET | Freq: Four times a day (QID) | ORAL | Status: DC | PRN

## 2016-02-16 NOTE — Progress Notes (Signed)
Subjective:  Sierra SimmerKristen Morrison is a 35 y.o. G3P0020 at 2885w6d being seen today for ongoing prenatal care.  She is currently monitored for the following issues for this high-risk pregnancy and has Vaginismus; Anxiety; Depression; Family history of breast cancer in first degree relative; Pregnancy; Endometriosis; Supervision of low-risk first pregnancy; and GBS bacteriuria on her problem list.  Patient reports nausea and vomiting.   . Vag. Bleeding: None.  Movement: Absent. Denies leaking of fluid.   The following portions of the patient's history were reviewed and updated as appropriate: allergies, current medications, past family history, past medical history, past social history, past surgical history and problem list. Problem list updated.  Objective:   Filed Vitals:   02/16/16 1448  BP: 117/80  Pulse: 79  Weight: 127 lb (57.607 kg)    Fetal Status: Fetal Heart Rate (bpm): 157   Movement: Absent     General:  Alert, oriented and cooperative. Patient is in no acute distress.  Skin: Skin is warm and dry. No rash noted.   Cardiovascular: Normal heart rate noted  Respiratory: Normal respiratory effort, no problems with respiration noted  Abdomen: Soft, gravid, appropriate for gestational age.       Pelvic: Vag. Bleeding: None Vag D/C Character: Thin   Cervical exam deferred        Extremities: Normal range of motion.  Edema: None  Mental Status: Normal mood and affect. Normal behavior. Normal judgment and thought content.   Urinalysis: Urine Protein: Negative Urine Glucose: Negative  Assessment and Plan:  Pregnancy: G3P0020 at 6185w6d  1. Encounter for supervision of normal first pregnancy in first trimester -Phenergan prn for nausea - Doxylamine-Pyridoxine 10-10 MG TBEC; Take 1 tablet @ bedtime and may increase to BID if needed  Dispense: 60 tablet; Refill: 3  3. Screening for chromosomal anomalies by amniocentesis -AFP next visit - US MFM OB COMP + 14 WK; Future  Preterm labor  symptoms and general obstetric precautions including but not limited to vaginal bleeding, contractions, leaking of fluid and fetal movement were reviewed in detail with the patient. Please refer to After Visit Summary for other counseling recommendations.  Return in about 4 weeks (around 03/15/2016).   Lesly DukesKelly H Jasenia Weilbacher, MD

## 2016-03-15 ENCOUNTER — Ambulatory Visit (INDEPENDENT_AMBULATORY_CARE_PROVIDER_SITE_OTHER): Payer: 59 | Admitting: Obstetrics & Gynecology

## 2016-03-15 VITALS — BP 115/73 | HR 72 | Wt 130.0 lb

## 2016-03-15 DIAGNOSIS — Z36 Encounter for antenatal screening of mother: Secondary | ICD-10-CM

## 2016-03-15 DIAGNOSIS — Z3492 Encounter for supervision of normal pregnancy, unspecified, second trimester: Secondary | ICD-10-CM

## 2016-03-15 DIAGNOSIS — Z3482 Encounter for supervision of other normal pregnancy, second trimester: Secondary | ICD-10-CM

## 2016-03-15 DIAGNOSIS — Z3402 Encounter for supervision of normal first pregnancy, second trimester: Secondary | ICD-10-CM

## 2016-03-15 NOTE — Progress Notes (Signed)
Subjective:  Sierra Morrison is a 35 y.o. G3P0020 at 4273w6d being seen today for ongoing prenatal care.  She is currently monitored for the following issues for this low-risk pregnancy and has Vaginismus; Anxiety; Depression; Family history of breast cancer in first degree relative; Pregnancy; Endometriosis; Supervision of low-risk first pregnancy; and GBS bacteriuria on her problem list.  Patient reports no complaints.   . Vag. Bleeding: None.  Movement: Absent. Denies leaking of fluid.   The following portions of the patient's history were reviewed and updated as appropriate: allergies, current medications, past family history, past medical history, past social history, past surgical history and problem list. Problem list updated.  Objective:   Filed Vitals:   03/15/16 1518  BP: 115/73  Pulse: 72  Weight: 130 lb (58.968 kg)    Fetal Status: Fetal Heart Rate (bpm): 155   Movement: Absent     General:  Alert, oriented and cooperative. Patient is in no acute distress.  Skin: Skin is warm and dry. No rash noted.   Cardiovascular: Normal heart rate noted  Respiratory: Normal respiratory effort, no problems with respiration noted  Abdomen: Soft, gravid, appropriate for gestational age. Pain/Pressure: Absent     Pelvic: Vag. Bleeding: None     Cervical exam deferred        Extremities: Normal range of motion.  Edema: None  Mental Status: Normal mood and affect. Normal behavior. Normal judgment and thought content.   Urinalysis: Urine Protein: Negative Urine Glucose: Negative  Assessment and Plan:  Pregnancy: G3P0020 at 2173w6d  1. Normal pregnancy in second trimester -PGD nml female - Alpha fetoprotein, maternal-Anatomy US scheduled with MFM   Preterm labor symptoms and general obstetric precautions including but not limited to vaginal bleeding, contractions, leaking of fluid and fetal movement were reviewed in detail with the patient. Please refer to After Visit Summary for other  counseling recommendations.  Return in about 4 weeks (around 04/12/2016).   Lesly DukesKelly H Kaysen Sefcik, MD

## 2016-03-16 LAB — ALPHA FETOPROTEIN, MATERNAL
AFP: 138.4 ng/mL
CURR GEST AGE: 17.9 wk
MoM for AFP: 2.91
Open Spina bifida: POSITIVE — AB

## 2016-03-17 ENCOUNTER — Encounter (HOSPITAL_COMMUNITY): Payer: Self-pay | Admitting: Obstetrics & Gynecology

## 2016-03-17 NOTE — Addendum Note (Signed)
Addended by: Granville LewisLARK, Stpehanie Montroy L on: 03/17/2016 12:40 PM   Modules accepted: Orders

## 2016-03-30 ENCOUNTER — Ambulatory Visit (HOSPITAL_COMMUNITY)
Admission: RE | Admit: 2016-03-30 | Discharge: 2016-03-30 | Disposition: A | Discharge status: Home / Self Care | Payer: 59 | Source: Ambulatory Visit | Attending: Obstetrics & Gynecology | Admitting: Obstetrics & Gynecology

## 2016-03-30 ENCOUNTER — Other Ambulatory Visit: Payer: Self-pay | Admitting: Obstetrics & Gynecology

## 2016-03-30 ENCOUNTER — Encounter (HOSPITAL_COMMUNITY): Payer: Self-pay

## 2016-03-30 VITALS — BP 120/68 | HR 83 | Wt 134.0 lb

## 2016-03-30 DIAGNOSIS — O283 Abnormal ultrasonic finding on antenatal screening of mother: Secondary | ICD-10-CM | POA: Diagnosis not present

## 2016-03-30 DIAGNOSIS — Z36 Encounter for antenatal screening for chromosomal anomalies: Secondary | ICD-10-CM

## 2016-03-30 DIAGNOSIS — Z3A20 20 weeks gestation of pregnancy: Secondary | ICD-10-CM | POA: Insufficient documentation

## 2016-03-30 DIAGNOSIS — O289 Unspecified abnormal findings on antenatal screening of mother: Secondary | ICD-10-CM

## 2016-03-30 DIAGNOSIS — Z3402 Encounter for supervision of normal first pregnancy, second trimester: Secondary | ICD-10-CM

## 2016-03-30 DIAGNOSIS — O09812 Supervision of pregnancy resulting from assisted reproductive technology, second trimester: Secondary | ICD-10-CM | POA: Diagnosis not present

## 2016-03-30 DIAGNOSIS — Z315 Encounter for genetic counseling: Secondary | ICD-10-CM | POA: Diagnosis not present

## 2016-03-30 NOTE — Progress Notes (Signed)
Genetic Counseling   DOB: 04-22-1981 Referring Provider: Lesly DukesLeggett, Kelly H, MD Appointment Date: @TODAY @ Attending:    Mrs. Wendie SimmerKristen Stennis and her husband, Mr. Suann LarryMatt Lasorsa, were seen for consultation for genetic counseling because of an elevated MSAFP of 2.91 MoMs based on maternal serum screening.    In summary:  Discussed elevated MSAFP   Reviewed possible explanations for elevation  Discussed additional options  Ultrasound  Amniocentesis  Discussed associations with unexplained elevated MSAFP  Reviewed family history concerns  Discussed carrier screening options  Had screening through reproductive endocrinology office, results not reviewed  We reviewed Mrs. Mulgrew's maternal serum screening result, the elevation of MSAFP, and the associated 1 in 106  risk for a fetal open neural tube defect.   We reviewed open neural tube defects including: the typical multifactorial etiology and variable prognosis.  In addition, we discussed alternative explanations for an elevated MSAFP including: normal variation, twins, feto-maternal bleeding, a gestational dating error, abdominal wall defects, kidney differences, oligohydramnios, and placental problems.  We discussed that an unexplained elevation of MSAFP is associated with an increased risk for third trimester complications including: prematurity, low birth weight, and pre-eclampsia.    We reviewed additional available screening and diagnostic options including detailed ultrasound and amniocentesis.  We discussed the risks, limitations, and benefits of each.  After thoughtful consideration of these options, Mrs. Elissa LovettHarshbarger elected to have ultrasound, but declined amniocentesis.  She understands that ultrasound cannot rule out all birth defects or genetic syndromes.  However, she was counseled that 80-90% of fetuses with open neural tube defects can be detected by detailed second trimester ultrasound, when well visualized.  A  complete ultrasound was performed today.  The ultrasound report will be sent under separate cover.  Mrs. Elissa LovettHarshbarger was provided with written information regarding cystic fibrosis (CF) including the carrier frequency and incidence in the Caucasian population, the availability of carrier testing and prenatal diagnosis if indicated.  In addition, we discussed that CF is routinely screened for as part of the [place] newborn screening panel.  She declined testing today.   Both family histories were reviewed and found to be noncontributory for birth defects, intellectual disability, and known genetic conditions.  Mrs. Elissa LovettHarshbarger reported that her mother was diagnosed with breast cancer at 7539 or 33 years of age.  Mrs. Elissa LovettHarshbarger has had genetic testing for breast cancer and a mutation was not found.  We discussed that genetic testing is most informative when a mutation has previously been identified in an affected family member.  We discussed the importance of follow up genetic screening should more information become available.  Without further information regarding the provided family history, an accurate genetic risk cannot be calculated. Further genetic counseling is warranted if more information is obtained.  Mrs. Elissa LovettHarshbarger denied exposure to environmental toxins or chemical agents. She denied the use of alcohol, tobacco or street drugs. She denied significant viral illnesses during the course of her pregnancy. Her medical and surgical histories were noncontributory.   I counseled this couple for approximately 40 minutes regarding the above risks and available options.    Mady Gemmaaragh Krystelle Prashad, MS,  Certified Genetic Counselor

## 2016-03-31 ENCOUNTER — Other Ambulatory Visit (HOSPITAL_COMMUNITY): Payer: Self-pay | Admitting: *Deleted

## 2016-03-31 DIAGNOSIS — O280 Abnormal hematological finding on antenatal screening of mother: Secondary | ICD-10-CM

## 2016-04-11 ENCOUNTER — Emergency Department
Admission: EM | Admit: 2016-04-11 | Discharge: 2016-04-11 | Disposition: A | Discharge status: Home / Self Care | Payer: 59 | Source: Home / Self Care | Attending: Family Medicine | Admitting: Family Medicine

## 2016-04-11 ENCOUNTER — Encounter: Payer: Self-pay | Admitting: *Deleted

## 2016-04-11 DIAGNOSIS — L089 Local infection of the skin and subcutaneous tissue, unspecified: Secondary | ICD-10-CM | POA: Diagnosis not present

## 2016-04-11 DIAGNOSIS — S61551A Open bite of right wrist, initial encounter: Secondary | ICD-10-CM | POA: Diagnosis not present

## 2016-04-11 DIAGNOSIS — W540XXA Bitten by dog, initial encounter: Principal | ICD-10-CM

## 2016-04-11 MED ORDER — AMOXICILLIN-POT CLAVULANATE 875-125 MG PO TABS: 1.0000 | ORAL_TABLET | Freq: Two times a day (BID) | ORAL | Status: DC

## 2016-04-11 NOTE — ED Provider Notes (Signed)
CSN: [id number]     Arrival date & time 04/11/16  0807 History   First MD Initiated Contact with Patient 04/11/16 [phone]     Chief Complaint  Patient presents with  . Animal Bite   (Consider location/radiation/quality/duration/timing/severity/associated sxs/prior Treatment) HPI  Sierra Morrison is a 35 y.o. female presenting to UC with c/o gradually worsening pain, redness, and swelling of her Right wrist after being bit by her schnauzer 2 days ago while attempting to break up a fight between her dog and another dog. Pt's own dog bit her.  Pain in wrist is aching and sore, 5/10, worse with movement and palpation. She is Right hand dominant.  Rabies vaccine is UTD. Pt's tetanus is UTD. She has been using OTC antibiotic ointment and ice but no relief.  She is currently [redacted] weeks pregnant. Denies fever, chills, n/v/d.    Past Medical History  Diagnosis Date  . Anxiety   . Depression   . History of abnormal cervical Pap smear   . Endometriosis   . Left ovarian cyst   . Infertility, female   . Wears glasses    Past Surgical History  Procedure Laterality Date  . Wisdom tooth extraction  age 89  . Mole removal  2003    spit nevi  . Nasal septum surgery  2007  . Laparoscopic ovarian cystectomy Left 03/25/2015    Procedure: LAPAROSCOPIC GELPORT ASSISTED LEFT OVARIAN CYSTECTOMY/EXCISION AND ABLATION OF ENDOMETROSIS/LYSIS OF ADHESIONS/CHROMOTUBATION;  Surgeon: Fermin Schwabamer Yalcinkaya, MD;  [facility] [place];  Service: Gynecology;  Laterality: Left;   Family History  Problem Relation Age of Onset  . Cancer Mother     breast  . Cancer Mother     skin  . Cancer Maternal Grandmother     breast  . Cancer Maternal Aunt     breast   Social History  Substance Use Topics  . Smoking status: Never Smoker   . Smokeless tobacco: Never Used  . Alcohol Use: 0.6 oz/week    1 Glasses of wine per week     Comment: occassionally   OB History    Gravida Para Term Preterm AB TAB SAB  Ectopic Multiple Living   3 0 0 0 2 0 2 0 0 0      Review of Systems  Constitutional: Negative for fever and chills.  Gastrointestinal: Negative for nausea and vomiting.  Musculoskeletal: Positive for myalgias, joint swelling and arthralgias.       Right wrist  Skin: Positive for color change and wound.    Allergies  Review of patient's allergies indicates no known allergies.  Home Medications   Prior to Admission medications   Medication Sig Start Date End Date Taking? Authorizing Provider  amoxicillin-clavulanate (AUGMENTIN) 875-125 MG tablet Take 1 tablet by mouth 2 (two) times daily. One po bid x 7 days 04/11/16   Junius FinnerErin O'Malley, PA-C  Doxylamine-Pyridoxine 10-10 MG TBEC Take 1 tablet @ bedtime and may increase to BID if needed 02/16/16   Lesly DukesKelly H Leggett, MD  Prenatal MV-Min-Fe Fum-FA-DHA (PRENATAL 1 PO) Take 1 tablet by mouth daily.     Historical Provider, MD  promethazine (PHENERGAN) 25 MG tablet Take 1 tablet (25 mg total) by mouth every 6 (six) hours as needed for nausea or vomiting. 02/16/16   Lesly DukesKelly H Leggett, MD  sertraline (ZOLOFT) 50 MG tablet Take 50 mg by mouth daily.    Historical Provider, MD   Meds Ordered and Administered this Visit  Medications - No data to  display  BP 104/70 mmHg  Pulse 90  Temp(Src) 98.5 F (36.9 C) (Oral)  Resp 14  Wt 138 lb (62.596 kg)  SpO2 99% No data found.   Physical Exam  Constitutional: She is oriented to person, place, and time. She appears well-developed and well-nourished.  HENT:  Head: Normocephalic and atraumatic.  Eyes: EOM are normal.  Neck: Normal range of motion.  Cardiovascular: Normal rate.   Pulses:      Radial pulses are 2+ on the right side.  Pulmonary/Chest: Effort normal.  Musculoskeletal: She exhibits edema and tenderness.  Right wrist: mild edema, slight decreased flexion and extension at wrist due to pain and swelling. 4/5 grip strength compared to Left. Tenderness to wrist.  Full ROM Right elbow w/o  tenderness. No tenderness to Right hand.   Neurological: She is alert and oriented to person, place, and time.  Right hand: normal sensation  Skin: Skin is warm and dry. There is erythema.  Right wrist: mild erythema w/o warmth. Multiple superficial abrasions and scabbed over puncture wounds to dorsal and ventral aspect of wrist. No active bleeding ir discharge. No red streaking.   Psychiatric: She has a normal mood and affect. Her behavior is normal.  Nursing note and vitals reviewed.   ED Course  Procedures (including critical care time)  Labs Review Labs Reviewed - No data to display  Imaging Review No results found.  Wound cleaned, bacitracin and bandage applied.  MDM   1. Infected dog bite of wrist, right, initial encounter    [facility] from 2 days ago. Exam concerning for underlying infection. Pt is afebrile, non-toxic appearing.  Pt is [redacted] weeks gestation. Pt and dog both UTD on vaccines.  Rx: Augmentin  Pt has routine f/u with OB/GYN tomorrow. Encouraged to let her know about new antibiotic. Home care instructions provided including encouraged warm compresses and may continue to use OTC antibiotic ointment in addition to Augmentin. Strongly encouraged f/u in 2-3 days with PCP, OB/GYN, or Urgent Care for recheck of symptoms to ensure improving, sooner if symptoms worsen significantly    Junius FinnerErin O'Malley, PA-C 04/11/16 [phone]

## 2016-04-11 NOTE — ED Notes (Signed)
Pt reports being bitten by her own dos 2 days ago while trying to stop another dog from biting him. SHe has his vaccine records with her, UTD on rabies. Pt reports she is UTD on TDaP. Some pain and swelling present. She has cleaned the site and applied antibiotic ointment. She is currently [redacted] weeks gestation

## 2016-04-11 NOTE — Discharge Instructions (Signed)
Please keep wounds clean with soap and water.  You may use warm compresses such as damp warm washcloths or warm showers.  You may apply over the counter antibiotic ointment on the wounds 2-3 times a day.  It does not need to be covered with a bandage, however, if you are out and about, or in a situation the wound could get dirty, such as gardening, please keep covered.    Please take antibiotics as prescribed and have the wounds rechecked in 2-3 days by your OB/GYN, Primary Care, or return to Urgent Care if needed.  Please have it evaluated sooner if symptoms worsen including severe pain, unable to move your wrist or fingers at all, fever, or vomiting.     Animal Bite Animal bites can range from mild to serious. An animal bite can result in a scratch on the skin, a deep open cut, a puncture of the skin, a crush injury, or tearing away of the skin or a body part. A small bite from a house pet will usually not cause serious problems. However, some animal bites can become infected or injure a bone or other tissue.  Bites from certain animals can be more dangerous because of the risk of spreading rabies, which is a serious viral infection. This risk is higher with bites from stray animals or wild animals, such as raccoons, foxes, skunks, and bats. Dogs are responsible for most animal bites. Children are bitten more often than adults. SYMPTOMS  Common symptoms of an animal bite include:   Pain.   Bleeding.   Swelling.   Bruising.  DIAGNOSIS  This condition may be diagnosed based on a physical exam and medical history. Your health care provider will examine the wound and ask for details about the animal and how the bite happened. You may also have tests, such as:   Blood tests to check for infection or to determine if surgery is needed.  X-rays to check for damage to bones or joints.  Culture test. This uses a sample of fluid from the wound to check for infection. TREATMENT  Treatment varies  depending on the location and type of animal bite and your medical history. Treatment may include:   Wound care. This often includes cleaning the wound, flushing the wound with saline solution, and applying a bandage (dressing). Sometimes, the wound is left open to heal because of the high risk of infection. However, in some cases, the wound may be closed with stitches (sutures), staples, skin glue, or adhesive strips.   Antibiotic medicine.   Tetanus shot.   Rabies treatment if the animal could have rabies.  In some cases, bites that have become infected may require IV antibiotics and surgical treatment in the hospital.  HOME CARE INSTRUCTIONS Wound Care  Follow instructions from your health care provider about how to take care of your wound. Make sure you:  Wash your hands with soap and water before you change your dressing. If soap and water are not available, use hand sanitizer.  Change your dressing as told by your health care provider.  Leave sutures, skin glue, or adhesive strips in place. These skin closures may need to be in place for 2 weeks or longer. If adhesive strip edges start to loosen and curl up, you may trim the loose edges. Do not remove adhesive strips completely unless your health care provider tells you to do that.  Check your wound every day for signs of infection. Watch for:   Increasing redness,  swelling, or pain.   Fluid, blood, or pus.  General Instructions  Take or apply over-the-counter and prescription medicines only as told by your health care provider.   If you were prescribed an antibiotic, take or apply it as told by your health care provider. Do not stop using the antibiotic even if your condition improves.   Keep the injured area raised (elevated) above the level of your heart while you are sitting or lying down, if this is possible.   Keep all follow-up visits as told by your health care provider. This is important.  SEEK MEDICAL  CARE IF:  You have increasing redness, swelling, or pain at the site of your wound.   You have a general feeling of sickness (malaise).   You feel nauseous or you vomit.   You have pain that does not get better.  SEEK IMMEDIATE MEDICAL CARE IF:  You have a red streak extending away from your wound.   You have fluid, blood, or pus coming from your wound.   You have a fever or chills.   You have trouble moving your injured area.   You have numbness or tingling extending beyond the wound.   This information is not intended to replace advice given to you by your health care provider. Make sure you discuss any questions you have with your health care provider.   Document Released: 06/21/2011 Document Revised: 06/24/2015 Document Reviewed: 02/18/2015 Elsevier Interactive Patient Education 2016 Else[employer] Inc.   Cellulitis Cellulitis is an infection of the skin and the tissue under the skin. The infected area is usually red and tender. This happens most often in the arms and lower legs. HOME CARE   Take your antibiotic medicine as told. Finish the medicine even if you start to feel better.  Keep the infected arm or leg raised (elevated).  Put a warm cloth on the area up to 4 times per day.  Only take medicines as told by your doctor.  Keep all doctor visits as told. GET HELP IF:  You see red streaks on the skin coming from the infected area.  Your red area gets bigger or turns a dark color.  Your bone or joint under the infected area is painful after the skin heals.  Your infection comes back in the same area or different area.  You have a puffy (swollen) bump in the infected area.  You have new symptoms.  You have a fever. GET HELP RIGHT AWAY IF:   You feel very sleepy.  You throw up (vomit) or have watery poop (diarrhea).  You feel sick and have muscle aches and pains.   This information is not intended to replace advice given to you by your health  care provider. Make sure you discuss any questions you have with your health care provider.   Document Released: 03/21/2008 Document Revised: 06/24/2015 Document Reviewed: 12/19/2011 Elsevier Interactive Patient Education [employer]

## 2016-04-12 ENCOUNTER — Ambulatory Visit (INDEPENDENT_AMBULATORY_CARE_PROVIDER_SITE_OTHER): Payer: 59 | Admitting: Obstetrics & Gynecology

## 2016-04-12 ENCOUNTER — Encounter: Payer: Self-pay | Admitting: Obstetrics & Gynecology

## 2016-04-12 VITALS — BP 101/62 | HR 80 | Wt 139.0 lb

## 2016-04-12 DIAGNOSIS — O43129 Velamentous insertion of umbilical cord, unspecified trimester: Secondary | ICD-10-CM | POA: Insufficient documentation

## 2016-04-12 DIAGNOSIS — Z3482 Encounter for supervision of other normal pregnancy, second trimester: Secondary | ICD-10-CM

## 2016-04-12 DIAGNOSIS — O43122 Velamentous insertion of umbilical cord, second trimester: Secondary | ICD-10-CM

## 2016-04-12 NOTE — Progress Notes (Signed)
Subjective:  Sierra SimmerKristen Morrison is a 35 y.o. G3P0020 at 4371w6d being seen today for ongoing prenatal care.  She is currently monitored for the following issues for this high-risk pregnancy and has Vaginismus; Anxiety; Depression; Family history of breast cancer in first degree relative; Pregnancy; Endometriosis; Supervision of low-risk first pregnancy; GBS bacteriuria; and Abnormal findings on antenatal screening on her problem list.  Patient reports no complaints.   . Vag. Bleeding: None.  Movement: Present. Denies leaking of fluid.   The following portions of the patient's history were reviewed and updated as appropriate: allergies, current medications, past family history, past medical history, past social history, past surgical history and problem list. Problem list updated.  Objective:   Filed Vitals:   04/12/16 1408  BP: 101/62  Pulse: 80  Weight: 139 lb (63.05 kg)    Fetal Status: Fetal Heart Rate (bpm): 151 Fundal Height: 20 cm Movement: Present     General:  Alert, oriented and cooperative. Patient is in no acute distress.  Skin: Skin is warm and dry. No rash noted.   Cardiovascular: Normal heart rate noted  Respiratory: Normal respiratory effort, no problems with respiration noted  Abdomen: Soft, gravid, appropriate for gestational age. Pain/Pressure: Absent     Pelvic: Cervical exam deferred        Extremities: Normal range of motion.  Edema: None  Mental Status: Normal mood and affect. Normal behavior. Normal judgment and thought content.   Urinalysis: Urine Protein: Negative Urine Glucose: Negative  Assessment and Plan:  Pregnancy: G3P0020 at 9371w6d  1.  Elevated AFP and velamentous cord insertion--serial growth US  Preterm labor symptoms and general obstetric precautions including but not limited to vaginal bleeding, contractions, leaking of fluid and fetal movement were reviewed in detail with the patient. Please refer to After Visit Summary for other counseling  recommendations.  No Follow-up on file.   Lesly DukesKelly H Glendola Friedhoff, MD

## 2016-04-12 NOTE — Progress Notes (Signed)
Got bit by her dog yesterday and was seen in UC

## 2016-05-03 ENCOUNTER — Ambulatory Visit (INDEPENDENT_AMBULATORY_CARE_PROVIDER_SITE_OTHER): Payer: 59 | Admitting: Obstetrics & Gynecology

## 2016-05-03 VITALS — BP 105/70 | HR 86 | Wt 141.0 lb

## 2016-05-03 DIAGNOSIS — R8271 Bacteriuria: Secondary | ICD-10-CM

## 2016-05-03 DIAGNOSIS — O43122 Velamentous insertion of umbilical cord, second trimester: Secondary | ICD-10-CM

## 2016-05-03 DIAGNOSIS — Z3402 Encounter for supervision of normal first pregnancy, second trimester: Secondary | ICD-10-CM

## 2016-05-03 NOTE — Progress Notes (Signed)
Subjective:  Sierra SimmerKristen Morrison is a 35 y.o. MW G3P0020 at 2965w6d being seen today for ongoing prenatal care.  She is currently monitored for the following issues for this low-risk pregnancy and has Vaginismus; Anxiety; Depression; Family history of breast cancer in first degree relative; Pregnancy; Endometriosis; Supervision of low-risk first pregnancy; GBS bacteriuria; Abnormal findings on antenatal screening; and Velamentous insertion of umbilical cord on her problem list.  Patient reports no complaints.   . Vag. Bleeding: None.  Movement: Present. Denies leaking of fluid.   The following portions of the patient's history were reviewed and updated as appropriate: allergies, current medications, past family history, past medical history, past social history, past surgical history and problem list. Problem list updated.  Objective:   Filed Vitals:   05/03/16 1408  BP: 105/70  Pulse: 86  Weight: 141 lb (63.957 kg)    Fetal Status: Fetal Heart Rate (bpm): 143   Movement: Present     General:  Alert, oriented and cooperative. Patient is in no acute distress.  Skin: Skin is warm and dry. No rash noted.   Cardiovascular: Normal heart rate noted  Respiratory: Normal respiratory effort, no problems with respiration noted  Abdomen: Soft, gravid, appropriate for gestational age. Pain/Pressure: Absent     Pelvic:  Cervical exam deferred        Extremities: Normal range of motion.  Edema: Trace  Mental Status: Normal mood and affect. Normal behavior. Normal judgment and thought content.   Urinalysis: Urine Protein: Negative Urine Glucose: Negative  Assessment and Plan:  Pregnancy: G3P0020 at 1265w6d  1. Velamentous insertion of umbilical cord, second trimester   2. GBS bacteriuria   3. Encounter for supervision of normal first pregnancy in second trimester   Preterm labor symptoms and general obstetric precautions including but not limited to vaginal bleeding, contractions, leaking of  fluid and fetal movement were reviewed in detail with the patient. Please refer to After Visit Summary for other counseling recommendations.  Return in about 3 weeks (around 05/24/2016) for glucola.   Allie BossierMyra C Adria Costley, MD

## 2016-05-10 ENCOUNTER — Encounter (HOSPITAL_COMMUNITY): Payer: Self-pay

## 2016-05-11 ENCOUNTER — Other Ambulatory Visit (HOSPITAL_COMMUNITY): Payer: Self-pay | Admitting: Maternal and Fetal Medicine

## 2016-05-11 ENCOUNTER — Ambulatory Visit (HOSPITAL_COMMUNITY)
Admission: RE | Admit: 2016-05-11 | Discharge: 2016-05-11 | Disposition: A | Discharge status: Home / Self Care | Payer: 59 | Source: Ambulatory Visit | Attending: Physician Assistant | Admitting: Physician Assistant

## 2016-05-11 ENCOUNTER — Encounter (HOSPITAL_COMMUNITY): Payer: Self-pay

## 2016-05-11 DIAGNOSIS — O280 Abnormal hematological finding on antenatal screening of mother: Secondary | ICD-10-CM

## 2016-05-11 DIAGNOSIS — O09812 Supervision of pregnancy resulting from assisted reproductive technology, second trimester: Secondary | ICD-10-CM | POA: Diagnosis not present

## 2016-05-11 DIAGNOSIS — O43122 Velamentous insertion of umbilical cord, second trimester: Secondary | ICD-10-CM | POA: Insufficient documentation

## 2016-05-11 DIAGNOSIS — Z3A26 26 weeks gestation of pregnancy: Secondary | ICD-10-CM

## 2016-05-11 DIAGNOSIS — O289 Unspecified abnormal findings on antenatal screening of mother: Secondary | ICD-10-CM | POA: Diagnosis not present

## 2016-05-11 DIAGNOSIS — R8271 Bacteriuria: Secondary | ICD-10-CM

## 2016-05-12 ENCOUNTER — Other Ambulatory Visit (HOSPITAL_COMMUNITY): Payer: Self-pay

## 2016-05-12 DIAGNOSIS — O280 Abnormal hematological finding on antenatal screening of mother: Secondary | ICD-10-CM

## 2016-05-24 ENCOUNTER — Ambulatory Visit (INDEPENDENT_AMBULATORY_CARE_PROVIDER_SITE_OTHER): Payer: 59 | Admitting: Obstetrics & Gynecology

## 2016-05-24 VITALS — BP 115/69 | HR 80 | Wt 146.0 lb

## 2016-05-24 DIAGNOSIS — Z3492 Encounter for supervision of normal pregnancy, unspecified, second trimester: Secondary | ICD-10-CM | POA: Diagnosis not present

## 2016-05-24 DIAGNOSIS — Z23 Encounter for immunization: Secondary | ICD-10-CM

## 2016-05-24 DIAGNOSIS — Z114 Encounter for screening for human immunodeficiency virus [HIV]: Secondary | ICD-10-CM

## 2016-05-24 DIAGNOSIS — Z3403 Encounter for supervision of normal first pregnancy, third trimester: Secondary | ICD-10-CM

## 2016-05-24 NOTE — Progress Notes (Signed)
Subjective:  Sierra Morrison is a 35 y.o. [id number] (boy)  at 5933w6d being seen today for ongoing prenatal care.  She is currently monitored for the following issues for this low-risk pregnancy and has Vaginismus; Anxiety; Depression; Family history of breast cancer in first degree relative; Pregnancy; Endometriosis; Supervision of low-risk first pregnancy; GBS bacteriuria; Abnormal findings on antenatal screening; and Velamentous insertion of umbilical cord on her problem list.  Patient reports no complaints.  Contractions: Not present. Vag. Bleeding: None.  Movement: Present. Denies leaking of fluid.   The following portions of the patient's history were reviewed and updated as appropriate: allergies, current medications, past family history, past medical history, past social history, past surgical history and problem list. Problem list updated.  Objective:   Vitals:   05/24/16 1402  BP: 115/69  Pulse: 80  Weight: 146 lb (66.2 kg)    Fetal Status: Fetal Heart Rate (bpm): 135   Movement: Present     General:  Alert, oriented and cooperative. Patient is in no acute distress.  Skin: Skin is warm and dry. No rash noted.   Cardiovascular: Normal heart rate noted  Respiratory: Normal respiratory effort, no problems with respiration noted  Abdomen: Soft, gravid, appropriate for gestational age. Pain/Pressure: Absent     Pelvic:  Cervical exam deferred        Extremities: Normal range of motion.  Edema: Trace  Mental Status: Normal mood and affect. Normal behavior. Normal judgment and thought content.   Urinalysis: Urine Protein: Trace Urine Glucose: Negative  Assessment and Plan:  Pregnancy: G3P0020 at 5333w6d  1. Normal pregnancy, second trimester  - Glucose Tolerance, 1 HR (50g) - CBC - HIV antibody (with reflex) - RPR - Tdap vaccine greater than or equal to 7yo IM  2. Encounter for supervision of normal first pregnancy in third trimester   Preterm labor symptoms and general  obstetric precautions including but not limited to vaginal bleeding, contractions, leaking of fluid and fetal movement were reviewed in detail with the patient. Please refer to After Visit Summary for other counseling recommendations.  No Follow-up on file.   Allie BossierMyra C Luvenia Cranford, MD

## 2016-05-25 LAB — GLUCOSE TOLERANCE, 1 HOUR (50G) W/O FASTING: GLUCOSE, 1 HR, GESTATIONAL: 86 mg/dL (ref <140)

## 2016-05-25 LAB — HIV ANTIBODY (ROUTINE TESTING W REFLEX): HIV: NONREACTIVE

## 2016-05-25 LAB — CBC
HCT: 31.1 % — ABNORMAL LOW (ref 35.0–45.0)
HEMOGLOBIN: 9.9 g/dL — AB (ref 11.7–15.5)
MCH: 28.4 pg (ref 27.0–33.0)
MCHC: 31.8 g/dL — AB (ref 32.0–36.0)
MCV: 89.4 fL (ref 80.0–100.0)
MPV: 10.3 fL (ref 7.5–12.5)
Platelets: 206 10*3/uL (ref 140–400)
RBC: 3.48 MIL/uL — ABNORMAL LOW (ref 3.80–5.10)
RDW: 13.4 % (ref 11.0–15.0)
WBC: 10.6 10*3/uL (ref 3.8–10.8)

## 2016-05-26 ENCOUNTER — Telehealth: Payer: Self-pay

## 2016-05-26 NOTE — Telephone Encounter (Signed)
The pt called the office asking for the results of her GTT and I looked at her lab results and told her that there was nothing abnormal about the GTT test results

## 2016-05-27 LAB — RPR

## 2016-06-08 ENCOUNTER — Other Ambulatory Visit (HOSPITAL_COMMUNITY): Payer: Self-pay | Admitting: Maternal and Fetal Medicine

## 2016-06-08 ENCOUNTER — Encounter (HOSPITAL_COMMUNITY): Payer: Self-pay

## 2016-06-08 ENCOUNTER — Ambulatory Visit (HOSPITAL_COMMUNITY)
Admission: RE | Admit: 2016-06-08 | Discharge: 2016-06-08 | Disposition: A | Discharge status: Home / Self Care | Payer: 59 | Source: Ambulatory Visit | Attending: Obstetrics & Gynecology | Admitting: Obstetrics & Gynecology

## 2016-06-08 DIAGNOSIS — O280 Abnormal hematological finding on antenatal screening of mother: Secondary | ICD-10-CM

## 2016-06-08 DIAGNOSIS — Z3A30 30 weeks gestation of pregnancy: Secondary | ICD-10-CM

## 2016-06-08 DIAGNOSIS — O09813 Supervision of pregnancy resulting from assisted reproductive technology, third trimester: Secondary | ICD-10-CM | POA: Insufficient documentation

## 2016-06-08 DIAGNOSIS — O43123 Velamentous insertion of umbilical cord, third trimester: Secondary | ICD-10-CM

## 2016-06-08 DIAGNOSIS — O289 Unspecified abnormal findings on antenatal screening of mother: Secondary | ICD-10-CM | POA: Insufficient documentation

## 2016-06-14 ENCOUNTER — Ambulatory Visit (INDEPENDENT_AMBULATORY_CARE_PROVIDER_SITE_OTHER): Payer: 59 | Admitting: Obstetrics & Gynecology

## 2016-06-14 ENCOUNTER — Encounter: Payer: Self-pay | Admitting: Obstetrics & Gynecology

## 2016-06-14 VITALS — BP 108/71 | HR 78 | Wt 150.0 lb

## 2016-06-14 DIAGNOSIS — O99343 Other mental disorders complicating pregnancy, third trimester: Secondary | ICD-10-CM

## 2016-06-14 DIAGNOSIS — O43123 Velamentous insertion of umbilical cord, third trimester: Secondary | ICD-10-CM

## 2016-06-14 DIAGNOSIS — Z3403 Encounter for supervision of normal first pregnancy, third trimester: Secondary | ICD-10-CM

## 2016-06-14 DIAGNOSIS — R8271 Bacteriuria: Secondary | ICD-10-CM

## 2016-06-14 DIAGNOSIS — F419 Anxiety disorder, unspecified: Secondary | ICD-10-CM

## 2016-06-14 NOTE — Progress Notes (Signed)
   PRENATAL VISIT NOTE  Subjective:  Sierra Morrison is a 35 y.o. G3P0020 at 2934w6d being seen today for ongoing prenatal care.  She is currently monitored for the following issues for this high-risk pregnancy and has Vaginismus; Anxiety; Depression; Family history of breast cancer in first degree relative; Pregnancy; Endometriosis; Supervision of low-risk first pregnancy; GBS bacteriuria; Abnormal findings on antenatal screening; and Velamentous insertion of umbilical cord on her problem list.  Patient reports no complaints.  Contractions: Not present. Vag. Bleeding: None.  Movement: Present. Denies leaking of fluid.   The following portions of the patient's history were reviewed and updated as appropriate: allergies, current medications, past family history, past medical history, past social history, past surgical history and problem list. Problem list updated.  Objective:   Vitals:   06/14/16 1430  BP: 108/71  Pulse: 78  Weight: 150 lb (68 kg)    Fetal Status: Fetal Heart Rate (bpm): 150   Movement: Present     General:  Alert, oriented and cooperative. Patient is in no acute distress.  Skin: Skin is warm and dry. No rash noted.   Cardiovascular: Normal heart rate noted  Respiratory: Normal respiratory effort, no problems with respiration noted  Abdomen: Soft, gravid, appropriate for gestational age. Pain/Pressure: Absent     Pelvic:  Cervical exam deferred        Extremities: Normal range of motion.  Edema: Trace  Mental Status: Normal mood and affect. Normal behavior. Normal judgment and thought content.   Urinalysis: Urine Protein: Trace Urine Glucose: Negative  Assessment and Plan:  Pregnancy: G3P0020 at 2134w6d  1. Velamentous insertion of umbilical cord, third trimester deliver by [redacted] weeks gestation  2. Encounter for supervision of normal first pregnancy in third trimester Rx for breast pump given today 3. GBS bacteriuria tx in labor  4. Anxiety   Preterm labor  symptoms and general obstetric precautions including but not limited to vaginal bleeding, contractions, leaking of fluid and fetal movement were reviewed in detail with the patient. Please refer to After Visit Summary for other counseling recommendations.  Return in about 2 weeks (around 06/28/2016).  Willodean Rosenthalarolyn Harraway-Smith, MD

## 2016-06-21 ENCOUNTER — Encounter: Payer: Self-pay | Admitting: *Deleted

## 2016-06-28 ENCOUNTER — Ambulatory Visit (INDEPENDENT_AMBULATORY_CARE_PROVIDER_SITE_OTHER): Payer: 59 | Admitting: Obstetrics and Gynecology

## 2016-06-28 VITALS — BP 104/62 | HR 82 | Wt 154.0 lb

## 2016-06-28 DIAGNOSIS — Z3403 Encounter for supervision of normal first pregnancy, third trimester: Secondary | ICD-10-CM

## 2016-06-28 DIAGNOSIS — O43123 Velamentous insertion of umbilical cord, third trimester: Secondary | ICD-10-CM

## 2016-06-28 DIAGNOSIS — R8271 Bacteriuria: Secondary | ICD-10-CM

## 2016-06-28 NOTE — Progress Notes (Signed)
   PRENATAL VISIT NOTE  Subjective:  Sierra Morrison is a 35 y.o. G3P0020 at 3390w6d being seen today for ongoing prenatal care.  She is currently monitored for the following issues for this high-risk pregnancy and has Vaginismus; Anxiety; Depression; Family history of breast cancer in first degree relative; Pregnancy; Endometriosis; Supervision of low-risk first pregnancy; GBS bacteriuria; Abnormal findings on antenatal screening; and Velamentous insertion of umbilical cord on her problem list.  Patient reports no complaints.  Contractions: Not present. Vag. Bleeding: None.  Movement: Present. Denies leaking of fluid.   The following portions of the patient's history were reviewed and updated as appropriate: allergies, current medications, past family history, past medical history, past social history, past surgical history and problem list. Problem list updated.  Objective:   Vitals:   06/28/16 1443  BP: 104/62  Pulse: 82  Weight: 154 lb (69.9 kg)    Fetal Status: Fetal Heart Rate (bpm): 137   Movement: Present     General:  Alert, oriented and cooperative. Patient is in no acute distress.  Skin: Skin is warm and dry. No rash noted.   Cardiovascular: Normal heart rate noted  Respiratory: Normal respiratory effort, no problems with respiration noted  Abdomen: Soft, gravid, appropriate for gestational age. Pain/Pressure: Absent     Pelvic:  Cervical exam deferred        Extremities: Normal range of motion.  Edema: Mild pitting, slight indentation  Mental Status: Normal mood and affect. Normal behavior. Normal judgment and thought content.   Urinalysis: Urine Protein: Negative Urine Glucose: Negative  Assessment and Plan:  Pregnancy: G3P0020 at 3390w6d  1. Encounter for supervision of normal first pregnancy in third trimester Patient is doing well without complaints  2. Velamentous insertion of umbilical cord, third trimester Follow up growth ultrasound 9/27  3. GBS  bacteriuria Will treat in labor  Preterm labor symptoms and general obstetric precautions including but not limited to vaginal bleeding, contractions, leaking of fluid and fetal movement were reviewed in detail with the patient. Please refer to After Visit Summary for other counseling recommendations.  Return in about 2 weeks (around 07/12/2016).  Catalina AntiguaPeggy Lexii Walsh, MD

## 2016-07-12 ENCOUNTER — Ambulatory Visit (INDEPENDENT_AMBULATORY_CARE_PROVIDER_SITE_OTHER): Payer: 59 | Admitting: Obstetrics & Gynecology

## 2016-07-12 VITALS — BP 122/78 | HR 71 | Wt 158.0 lb

## 2016-07-12 DIAGNOSIS — Z3403 Encounter for supervision of normal first pregnancy, third trimester: Secondary | ICD-10-CM

## 2016-07-12 NOTE — Progress Notes (Signed)
Pt declined flu shot at the clinic because she gets it at work

## 2016-07-12 NOTE — Progress Notes (Signed)
   PRENATAL VISIT NOTE  Subjective:  Sierra Morrison is a 35 y.o. G3P0020 at 2838w6d being seen today for ongoing prenatal care.  She is currently monitored for the following issues for this low-risk pregnancy and has Vaginismus; Anxiety; Depression; Family history of breast cancer in first degree relative; Pregnancy; Endometriosis; Supervision of low-risk first pregnancy; GBS bacteriuria; Abnormal findings on antenatal screening; and Velamentous insertion of umbilical cord on her problem list.  Patient reports no complaints.  Contractions: Not present. Vag. Bleeding: None.  Movement: Present. Denies leaking of fluid.   The following portions of the patient's history were reviewed and updated as appropriate: allergies, current medications, past family history, past medical history, past social history, past surgical history and problem list. Problem list updated.  Objective:   Vitals:   07/12/16 1445  BP: 122/78  Pulse: 71  Weight: 158 lb (71.7 kg)    Fetal Status: Fetal Heart Rate (bpm): 150 Fundal Height: 34 cm Movement: Present     General:  Alert, oriented and cooperative. Patient is in no acute distress.  Skin: Skin is warm and dry. No rash noted.   Cardiovascular: Normal heart rate noted  Respiratory: Normal respiratory effort, no problems with respiration noted  Abdomen: Soft, gravid, appropriate for gestational age. Pain/Pressure: Absent     Pelvic:  Cervical exam deferred        Extremities: Normal range of motion.  Edema: Mild pitting, slight indentation  Mental Status: Normal mood and affect. Normal behavior. Normal judgment and thought content.   Urinalysis: Urine Protein: Negative Urine Glucose: Negative  Assessment and Plan:  Pregnancy: G3P0020 at 6838w6d  1. Encounter for supervision of normal first pregnancy in third trimester Preterm labor symptoms and general obstetric precautions including but not limited to vaginal bleeding, contractions, leaking of fluid and  fetal movement were reviewed in detail with the patient. Please refer to After Visit Summary for other counseling recommendations.  Return in about 1 week (around 07/19/2016) for Pelvic cultures, OB Visit.  Tereso NewcomerUgonna A Keyston Ardolino, MD

## 2016-07-12 NOTE — Patient Instructions (Signed)
Return to clinic for any scheduled appointments or obstetric concerns, or go to [facility] for evaluation  

## 2016-07-13 ENCOUNTER — Other Ambulatory Visit (HOSPITAL_COMMUNITY): Payer: Self-pay | Admitting: Maternal and Fetal Medicine

## 2016-07-13 ENCOUNTER — Encounter (HOSPITAL_COMMUNITY): Payer: Self-pay

## 2016-07-13 ENCOUNTER — Ambulatory Visit (HOSPITAL_COMMUNITY)
Admission: RE | Admit: 2016-07-13 | Discharge: 2016-07-13 | Disposition: A | Discharge status: Home / Self Care | Payer: 59 | Source: Ambulatory Visit | Attending: Obstetrics & Gynecology | Admitting: Obstetrics & Gynecology

## 2016-07-13 DIAGNOSIS — O09813 Supervision of pregnancy resulting from assisted reproductive technology, third trimester: Secondary | ICD-10-CM | POA: Diagnosis not present

## 2016-07-13 DIAGNOSIS — O43123 Velamentous insertion of umbilical cord, third trimester: Secondary | ICD-10-CM | POA: Diagnosis not present

## 2016-07-13 DIAGNOSIS — O280 Abnormal hematological finding on antenatal screening of mother: Secondary | ICD-10-CM

## 2016-07-13 DIAGNOSIS — Z3A35 35 weeks gestation of pregnancy: Secondary | ICD-10-CM | POA: Insufficient documentation

## 2016-07-13 DIAGNOSIS — O283 Abnormal ultrasonic finding on antenatal screening of mother: Secondary | ICD-10-CM | POA: Insufficient documentation

## 2016-07-20 ENCOUNTER — Ambulatory Visit (HOSPITAL_COMMUNITY): Payer: 59

## 2016-07-21 ENCOUNTER — Ambulatory Visit (INDEPENDENT_AMBULATORY_CARE_PROVIDER_SITE_OTHER): Payer: 59 | Admitting: Obstetrics & Gynecology

## 2016-07-21 VITALS — BP 121/78

## 2016-07-21 DIAGNOSIS — Z23 Encounter for immunization: Secondary | ICD-10-CM | POA: Diagnosis not present

## 2016-07-21 DIAGNOSIS — Z3403 Encounter for supervision of normal first pregnancy, third trimester: Secondary | ICD-10-CM

## 2016-07-21 DIAGNOSIS — Z113 Encounter for screening for infections with a predominantly sexual mode of transmission: Secondary | ICD-10-CM

## 2016-07-21 DIAGNOSIS — O43123 Velamentous insertion of umbilical cord, third trimester: Secondary | ICD-10-CM

## 2016-07-21 LAB — OB RESULTS CONSOLE GC/CHLAMYDIA
Chlamydia: NEGATIVE
GC PROBE AMP, GENITAL: NEGATIVE

## 2016-07-21 NOTE — Progress Notes (Signed)
   PRENATAL VISIT NOTE  Subjective:  Sierra Morrison is a 35 y.o. G3P0020 at 790w1d being seen today for ongoing prenatal care.  She is currently monitored for the following issues for this high-risk pregnancy and has Vaginismus; Anxiety; Depression; Family history of breast cancer in first degree relative; Pregnancy; Endometriosis; Supervision of low-risk first pregnancy; GBS bacteriuria; Abnormal findings on antenatal screening; and Velamentous insertion of umbilical cord on her problem list.  Patient reports no complaints.  Contractions: Not present. Vag. Bleeding: None.  Movement: Present. Denies leaking of fluid.   The following portions of the patient's history were reviewed and updated as appropriate: allergies, current medications, past family history, past medical history, past social history, past surgical history and problem list. Problem list updated.  Objective:   Vitals:   07/21/16 1449  BP: 121/78    Fetal Status: Fetal Heart Rate (bpm): 150   Movement: Present     General:  Alert, oriented and cooperative. Patient is in no acute distress.  Skin: Skin is warm and dry. No rash noted.   Cardiovascular: Normal heart rate noted  Respiratory: Normal respiratory effort, no problems with respiration noted  Abdomen: Soft, gravid, appropriate for gestational age. Pain/Pressure: Absent     Pelvic:  Cervical exam performed        Extremities: Normal range of motion.     Mental Status: Normal mood and affect. Normal behavior. Normal judgment and thought content.   Urinalysis:      Assessment and Plan:  Pregnancy: G3P0020 at 2290w1d  1. Encounter for supervision of normal first pregnancy in third trimester - Flu Vaccine QUAD 36+ mos IM (Fluarix, Quad PF) - GC/Chlamydia Probe Amp  2. Velamentous insertion of umbilical cord in third trimester -Nml growth at 35 weeks. -Delivery at 40 weeks  Term labor symptoms and general obstetric precautions including but not limited to  vaginal bleeding, contractions, leaking of fluid and fetal movement were reviewed in detail with the patient. Please refer to After Visit Summary for other counseling recommendations.  Return in about 2 weeks (around 08/04/2016).  Lesly DukesKelly H Jorje Vanatta, MD

## 2016-07-23 LAB — GC/CHLAMYDIA PROBE AMP
CT PROBE, AMP APTIMA: NOT DETECTED
GC PROBE AMP APTIMA: NOT DETECTED

## 2016-07-29 ENCOUNTER — Ambulatory Visit (INDEPENDENT_AMBULATORY_CARE_PROVIDER_SITE_OTHER): Payer: Self-pay | Admitting: Family

## 2016-07-29 VITALS — BP 127/85 | HR 96 | Wt 160.0 lb

## 2016-07-29 DIAGNOSIS — Z3403 Encounter for supervision of normal first pregnancy, third trimester: Secondary | ICD-10-CM

## 2016-07-29 DIAGNOSIS — O43123 Velamentous insertion of umbilical cord, third trimester: Secondary | ICD-10-CM

## 2016-07-29 DIAGNOSIS — Z3483 Encounter for supervision of other normal pregnancy, third trimester: Secondary | ICD-10-CM

## 2016-07-29 NOTE — Progress Notes (Signed)
   PRENATAL VISIT NOTE  Subjective:  Sierra Morrison is a 35 y.o. G3P0020 at 1320w2d being seen today for ongoing prenatal care.  She is currently monitored for the following issues for this low-risk pregnancy and has Vaginismus; Anxiety; Depression; Family history of breast cancer in first degree relative; Pregnancy; Endometriosis; Supervision of low-risk first pregnancy; GBS bacteriuria; Abnormal findings on antenatal screening; and Velamentous insertion of umbilical cord on her problem list.  Patient reports occasional contractions.  Contractions: Not present. Vag. Bleeding: None.  Movement: Present. Denies leaking of fluid.   The following portions of the patient's history were reviewed and updated as appropriate: allergies, current medications, past family history, past medical history, past social history, past surgical history and problem list. Problem list updated.  Objective:   Vitals:   07/29/16 0810  BP: 127/85  Pulse: 96  Weight: 160 lb (72.6 kg)    Fetal Status: Fetal Heart Rate (bpm): 143 Fundal Height: 36 cm Movement: Present     General:  Alert, oriented and cooperative. Patient is in no acute distress.  Skin: Skin is warm and dry. No rash noted.   Cardiovascular: Normal heart rate noted  Respiratory: Normal respiratory effort, no problems with respiration noted  Abdomen: Soft, gravid, appropriate for gestational age. Pain/Pressure: Absent     Pelvic:  Cervical exam deferred        Extremities: Normal range of motion.  Edema: Mild pitting, slight indentation  Mental Status: Normal mood and affect. Normal behavior. Normal judgment and thought content.   Urinalysis: Urine Protein: Negative Urine Glucose: Negative  Assessment and Plan:  Pregnancy: G3P0020 at 220w2d  1. Encounter for supervision of low-risk first pregnancy in third trimester - GBS in urine > treat in labor - Given letter to begin maternity leave at 39 wks  2.  Velamentous insertion of umbilical  cord in third trimester - IOL scheduled for 40 wks  Term labor symptoms and general obstetric precautions including but not limited to vaginal bleeding, contractions, leaking of fluid and fetal movement were reviewed in detail with the patient. Please refer to After Visit Summary for other counseling recommendations.  Return in 1 week (on 08/05/2016).  Eino FarberWalidah Kennith GainN Karim, CNM

## 2016-07-29 NOTE — Patient Instructions (Signed)

## 2016-08-04 ENCOUNTER — Encounter (HOSPITAL_COMMUNITY): Payer: Self-pay

## 2016-08-04 ENCOUNTER — Ambulatory Visit (INDEPENDENT_AMBULATORY_CARE_PROVIDER_SITE_OTHER): Payer: 59 | Admitting: Obstetrics & Gynecology

## 2016-08-04 ENCOUNTER — Inpatient Hospital Stay (EMERGENCY_DEPARTMENT_HOSPITAL)
Admission: AD | Admit: 2016-08-04 | Discharge: 2016-08-04 | Disposition: A | Discharge status: Home / Self Care | Payer: 59 | Source: Ambulatory Visit | Attending: Obstetrics & Gynecology | Admitting: Obstetrics & Gynecology

## 2016-08-04 ENCOUNTER — Encounter: Payer: Self-pay | Admitting: Obstetrics & Gynecology

## 2016-08-04 VITALS — BP 141/87 | HR 72 | Wt 162.0 lb

## 2016-08-04 DIAGNOSIS — O163 Unspecified maternal hypertension, third trimester: Secondary | ICD-10-CM

## 2016-08-04 DIAGNOSIS — O4202 Full-term premature rupture of membranes, onset of labor within 24 hours of rupture: Secondary | ICD-10-CM | POA: Diagnosis not present

## 2016-08-04 DIAGNOSIS — R8271 Bacteriuria: Secondary | ICD-10-CM

## 2016-08-04 DIAGNOSIS — Z3403 Encounter for supervision of normal first pregnancy, third trimester: Secondary | ICD-10-CM

## 2016-08-04 DIAGNOSIS — O43123 Velamentous insertion of umbilical cord, third trimester: Secondary | ICD-10-CM

## 2016-08-04 LAB — COMPREHENSIVE METABOLIC PANEL
ALBUMIN: 3.2 g/dL — AB (ref 3.5–5.0)
ALK PHOS: 240 U/L — AB (ref 38–126)
ALT: 12 U/L — AB (ref 14–54)
AST: 25 U/L (ref 15–41)
Anion gap: 8 (ref 5–15)
BUN: 14 mg/dL (ref 6–20)
CALCIUM: 9.1 mg/dL (ref 8.9–10.3)
CHLORIDE: 106 mmol/L (ref 101–111)
CO2: 23 mmol/L (ref 22–32)
CREATININE: 0.81 mg/dL (ref 0.44–1.00)
GFR calc non Af Amer: >60 mL/min (ref >60)
GLUCOSE: 77 mg/dL (ref 65–99)
Potassium: 4.2 mmol/L (ref 3.5–5.1)
SODIUM: 137 mmol/L (ref 135–145)
Total Bilirubin: 0.6 mg/dL (ref 0.3–1.2)
Total Protein: 7 g/dL (ref 6.5–8.1)

## 2016-08-04 LAB — CBC
HCT: 32.6 % — ABNORMAL LOW (ref 36.0–46.0)
HEMOGLOBIN: 10.2 g/dL — AB (ref 12.0–15.0)
MCH: 25.4 pg — AB (ref 26.0–34.0)
MCHC: 31.3 g/dL (ref 30.0–36.0)
MCV: 81.3 fL (ref 78.0–100.0)
PLATELETS: 187 10*3/uL (ref 150–400)
RBC: 4.01 MIL/uL (ref 3.87–5.11)
RDW: 16 % — ABNORMAL HIGH (ref 11.5–15.5)
WBC: 12.5 10*3/uL — AB (ref 4.0–10.5)

## 2016-08-04 LAB — PROTEIN / CREATININE RATIO, URINE
Creatinine, Urine: 42 mg/dL
Protein Creatinine Ratio: 0.21 mg/mg{Cre} — ABNORMAL HIGH (ref 0.00–0.15)
TOTAL PROTEIN, URINE: 9 mg/dL

## 2016-08-04 MED ORDER — ACETAMINOPHEN 325 MG PO TABS
650.0000 mg | ORAL_TABLET | Freq: Four times a day (QID) | ORAL | Status: DC | PRN
  Administered 2016-08-04: 650 mg via ORAL
  Filled 2016-08-04: qty 2

## 2016-08-04 NOTE — Discharge Instructions (Signed)

## 2016-08-04 NOTE — MAU Provider Note (Signed)
Chief Complaint:  Hypertension and Headache   First Provider Initiated Contact with Patient 08/04/16 1846     HPI: Sierra Morrison is a 35 y.o. G3P0020 at 61w1dwho presents to maternity admissions reporting Hypertension in office today.  Has a headache and some blurry vision.  She reports good fetal movement, denies LOF, vaginal bleeding, vaginal itching/burning, urinary symptoms, dizziness, n/v, diarrhea, constipation or fever/chills.  She denies headache, visual changes or RUQ abdominal pain.  Hypertension  This is a new problem. The current episode started today. The problem is unchanged. Associated symptoms include blurred vision and headaches. Pertinent negatives include no anxiety, palpitations, peripheral edema or shortness of breath. There are no associated agents to hypertension. Past treatments include nothing. There are no compliance problems.    RN Note: Pt sent over from MD office for pre-eclampsia evaluation. Pt's BP was elevated at MD office. Pt c/o headache and blurry vision today. Pt states baby has been moving normally. Pt denies bleeding and leaking of fluid.  Past Medical History: Past Medical History:  Diagnosis Date  . Anxiety   . Depression   . Endometriosis   . History of abnormal cervical Pap smear   . Infertility, female   . Left ovarian cyst   . Wears glasses     Past obstetric history: OB History  Gravida Para Term Preterm AB Living  3 0 0 0 2 0  SAB TAB Ectopic Multiple Live Births  2 0 0 0      # Outcome Date GA Lbr Len/2nd Weight Sex Delivery Anes PTL Lv  3 Current           2 SAB           1 SAB               Past Surgical History: Past Surgical History:  Procedure Laterality Date  . LAPAROSCOPIC OVARIAN CYSTECTOMY Left 03/25/2015   Procedure: LAPAROSCOPIC GELPORT ASSISTED LEFT OVARIAN CYSTECTOMY/EXCISION AND ABLATION OF ENDOMETROSIS/LYSIS OF ADHESIONS/CHROMOTUBATION;  Surgeon: Fermin Schwab, MD;  Location: [facility];   Service: Gynecology;  Laterality: Left;  . MOLE REMOVAL  2003   spit nevi  . NASAL SEPTUM SURGERY  2007  . WISDOM TOOTH EXTRACTION  age 35    Family History: Family History  Problem Relation Age of Onset  . Cancer Mother     breast/skin  . Cancer Maternal Grandmother     breast  . Cancer Maternal Aunt     breast    Social History: Social History  Substance Use Topics  . Smoking status: Never Smoker  . Smokeless tobacco: Never Used  . Alcohol use 0.6 oz/week    1 Glasses of wine per week     Comment: occassionally    Allergies: No Known Allergies  Meds:  Prescriptions Prior to Admission  Medication Sig Dispense Refill Last Dose  . acetaminophen (TYLENOL) 325 MG tablet Take 650 mg by mouth every 6 (six) hours as needed for mild pain, moderate pain or headache.   Past Month at Unknown time  . Prenatal MV-Min-FA-Omega-3 (PRENATAL GUMMIES/DHA & FA) 0.4-32.5 MG CHEW Chew 2 each by mouth at bedtime.   08/03/2016 at Unknown time  . sertraline (ZOLOFT) 50 MG tablet Take 50 mg by mouth at bedtime.   08/03/2016 at Unknown time    I have reviewed patient's Past Medical Hx, Surgical Hx, Family Hx, Social Hx, medications and allergies.   ROS:  Review of Systems  Eyes: Positive for blurred vision.  Respiratory: Negative for shortness of breath.   Cardiovascular: Negative for palpitations.  Neurological: Positive for headaches.   Other systems negative  Physical Exam  Patient Vitals for the past 24 hrs:  BP Temp Temp src Pulse Resp SpO2 Height Weight  08/04/16 1905 130/79 - - 74 - 100 % - -  08/04/16 1900 - - - 72 - 100 % - -  08/04/16 1855 - - - 68 - 100 % - -  08/04/16 1851 - - - 70 - - - -  08/04/16 1850 147/83 - - 69 - 100 % - -  08/04/16 1845 - - - 68 - 100 % - -  08/04/16 1840 - - - 72 - 100 % - -  08/04/16 1836 - - - 72 - - - -  08/04/16 1835 143/80 - - 71 - 100 % - -  08/04/16 1833 134/81 - - 74 - - - -  08/04/16 1830 - - - 72 - 100 % 5\' 2"  (1.575 m) 73.5 kg (162  lb)  08/04/16 1826 141/88 98.6 F (37 C) Oral 73 18 100 % - -  08/04/16 1825 - - - 74 - 93 % - -   Constitutional: Well-developed, well-nourished female in no acute distress.  Cardiovascular: normal rate and rhythm Respiratory: normal effort, clear to auscultation bilaterally GI: Abd soft, non-tender, gravid appropriate for gestational age.   No rebound or guarding. MS: Extremities nontender, no edema, normal ROM Neurologic: Alert and oriented x 4.  GU: Neg CVAT.   FHT:  Baseline 140 , moderate variability, accelerations present, no decelerations Contractions: Irregular    Labs: Results for orders placed or performed during the hospital encounter of 08/04/16 (from the past 24 hour(s))  Protein / creatinine ratio, urine     Status: Abnormal   Collection Time: 08/04/16  6:10 PM  Result Value Ref Range   Creatinine, Urine 42.00 mg/dL   Total Protein, Urine 9 mg/dL   Protein Creatinine Ratio 0.21 (H) 0.00 - 0.15 mg/mg[Cre]  CBC     Status: Abnormal   Collection Time: 08/04/16  6:18 PM  Result Value Ref Range   WBC 12.5 (H) 4.0 - 10.5 K/uL   RBC 4.01 3.87 - 5.11 MIL/uL   Hemoglobin 10.2 (L) 12.0 - 15.0 g/dL   HCT 29.532.6 (L) 62.136.0 - 30.846.0 %   MCV 81.3 78.0 - 100.0 fL   MCH 25.4 (L) 26.0 - 34.0 pg   MCHC 31.3 30.0 - 36.0 g/dL   RDW 65.716.0 (H) 84.611.5 - 96.215.5 %   Platelets 187 150 - 400 K/uL  Comprehensive metabolic panel     Status: Abnormal   Collection Time: 08/04/16  6:18 PM  Result Value Ref Range   Sodium 137 135 - 145 mmol/L   Potassium 4.2 3.5 - 5.1 mmol/L   Chloride 106 101 - 111 mmol/L   CO2 23 22 - 32 mmol/L   Glucose, Bld 77 65 - 99 mg/dL   BUN 14 6 - 20 mg/dL   Creatinine, Ser 9.520.81 0.44 - 1.00 mg/dL   Calcium 9.1 8.9 - 84.110.3 mg/dL   Total Protein 7.0 6.5 - 8.1 g/dL   Albumin 3.2 (L) 3.5 - 5.0 g/dL   AST 25 15 - 41 U/L   ALT 12 (L) 14 - 54 U/L   Alkaline Phosphatase 240 (H) 38 - 126 U/L   Total Bilirubin 0.6 0.3 - 1.2 mg/dL   GFR calc non Af Amer >60 >60 mL/min   GFR  calc Af Amer >60 >60 mL/min   Anion gap 8 5 - 15   O/POS/-- (04/03 1457)  Imaging:  [place] [facility] Ob Follow Up  Result Date: 07/13/2016 OBSTETRICAL ULTRASOUND: This exam was performed within a [place] [facility] Department. The OB US report was generated in the AS system, and faxed to the ordering physician.  This report is available in the [employer]. See the AS Obstetric US report via the Image Link.   MAU Course/MDM: I have ordered labs and reviewed results. Labs are normal .  BPs are mildly elevated, no severe range NST reviewed  Treatments in MAU included Tylenol which completely resolved headache.    Assessment: 1. 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 GBS bacteriuria   3.     SIUP at [redacted]w[redacted]d 4.     Gestational Hypertension without evidence of preeclampsia  Plan: Discharge home Labor precautions and fetal kick counts Follow up in Office for prenatal visits and recheck of BPs  Encouraged to return here or to other Urgent Care/ED if she develops worsening of symptoms, increase in pain, fever, or other concerning symptoms.   Pt stable at time of discharge.  Wynelle Bourgeois CNM, MSN Certified Nurse-Midwife 08/04/2016 7:27 PM

## 2016-08-04 NOTE — Progress Notes (Signed)
   PRENATAL VISIT NOTE  Subjective:  Sierra Morrison is a 35 y.o. G3P0020 at 655w1d being seen today for ongoing prenatal care.  She is currently monitored for the following issues for this low-risk pregnancy and has Vaginismus; Anxiety; Depression; Family history of breast cancer in first degree relative; Pregnancy; Endometriosis; Supervision of low-risk first pregnancy; GBS bacteriuria; Abnormal findings on antenatal screening; and Velamentous insertion of umbilical cord on her problem list.  Patient reports headache and visual changes for a week.  Contractions: Not present. Vag. Bleeding: None.  Movement: Present. Denies leaking of fluid.   The following portions of the patient's history were reviewed and updated as appropriate: allergies, current medications, past family history, past medical history, past social history, past surgical history and problem list. Problem list updated.  Objective:   Vitals:   08/04/16 1547  BP: (!) 141/87  Pulse: 72  Weight: 162 lb (73.5 kg)    Fetal Status: Fetal Heart Rate (bpm): 142   Movement: Present     General:  Alert, oriented and cooperative. Patient is in no acute distress.  Skin: Skin is warm and dry. No rash noted.   Cardiovascular: Normal heart rate noted  Respiratory: Normal respiratory effort, no problems with respiration noted  Abdomen: Soft, gravid, appropriate for gestational age. Pain/Pressure: Absent     Pelvic:  Cervical exam performed        Extremities: Normal range of motion.  Edema: Mild pitting, slight indentation  Mental Status: Normal mood and affect. Normal behavior. Normal judgment and thought content.  DTR- 4+ Assessment and Plan:  Pregnancy: G3P0020 at 415w1d  1. 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 Encounter for supervision of low-risk first pregnancy in third trimester - elevated BP and symptoms of pre eclmapsia- To MAU for evaluation  3. GBS  bacteriuria - Treat in labor   Term labor symptoms and general obstetric precautions including but not limited to vaginal bleeding, contractions, leaking of fluid and fetal movement were reviewed in detail with the patient. Please refer to After Visit Summary for other counseling recommendations.  No Follow-up on file.  Allie BossierMyra C Abrish Erny, MD

## 2016-08-04 NOTE — MAU Note (Signed)
Pt sent over from MD office for pre-eclampsia evaluation. Pt's BP was elevated at MD office. Pt c/o headache and blurry vision today. Pt states baby has been moving normally. Pt denies bleeding and leaking of fluid.

## 2016-08-05 ENCOUNTER — Inpatient Hospital Stay (HOSPITAL_COMMUNITY): Payer: 59 | Admitting: Anesthesiology

## 2016-08-05 ENCOUNTER — Inpatient Hospital Stay (HOSPITAL_COMMUNITY)
Admission: AD | Admit: 2016-08-05 | Discharge: 2016-08-08 | DRG: 775 | Disposition: A | Discharge status: Home / Self Care | Payer: 59 | Source: Ambulatory Visit | Attending: Obstetrics & Gynecology | Admitting: Obstetrics & Gynecology

## 2016-08-05 ENCOUNTER — Telehealth (HOSPITAL_COMMUNITY): Payer: Self-pay | Admitting: *Deleted

## 2016-08-05 ENCOUNTER — Encounter (HOSPITAL_COMMUNITY): Payer: Self-pay

## 2016-08-05 ENCOUNTER — Encounter: Payer: Self-pay | Admitting: *Deleted

## 2016-08-05 DIAGNOSIS — O4202 Full-term premature rupture of membranes, onset of labor within 24 hours of rupture: Secondary | ICD-10-CM | POA: Diagnosis present

## 2016-08-05 DIAGNOSIS — Z3A38 38 weeks gestation of pregnancy: Secondary | ICD-10-CM

## 2016-08-05 DIAGNOSIS — O43123 Velamentous insertion of umbilical cord, third trimester: Secondary | ICD-10-CM

## 2016-08-05 DIAGNOSIS — O99824 Streptococcus B carrier state complicating childbirth: Secondary | ICD-10-CM | POA: Diagnosis present

## 2016-08-05 DIAGNOSIS — R8271 Bacteriuria: Secondary | ICD-10-CM

## 2016-08-05 LAB — CBC
HCT: 32.7 % — ABNORMAL LOW (ref 36.0–46.0)
HEMOGLOBIN: 10.4 g/dL — AB (ref 12.0–15.0)
MCH: 25.4 pg — AB (ref 26.0–34.0)
MCHC: 31.8 g/dL (ref 30.0–36.0)
MCV: 80 fL (ref 78.0–100.0)
Platelets: 209 10*3/uL (ref 150–400)
RBC: 4.09 MIL/uL (ref 3.87–5.11)
RDW: 16.1 % — ABNORMAL HIGH (ref 11.5–15.5)
WBC: 15.8 10*3/uL — ABNORMAL HIGH (ref 4.0–10.5)

## 2016-08-05 LAB — POCT FERN TEST: POCT FERN TEST: POSITIVE

## 2016-08-05 MED ORDER — ONDANSETRON HCL 4 MG/2ML IJ SOLN: 4.0000 mg | Freq: Four times a day (QID) | INTRAMUSCULAR | Status: DC | PRN

## 2016-08-05 MED ORDER — SOD CITRATE-CITRIC ACID 500-334 MG/5ML PO SOLN: 30.0000 mL | ORAL | Status: DC | PRN

## 2016-08-05 MED ORDER — LIDOCAINE HCL (PF) 1 % IJ SOLN
30.0000 mL | INTRAMUSCULAR | Status: DC | PRN
  Filled 2016-08-05: qty 30

## 2016-08-05 MED ORDER — LACTATED RINGERS IV SOLN: 500.0000 mL | Freq: Once | INTRAVENOUS | Status: DC

## 2016-08-05 MED ORDER — PENICILLIN G POTASSIUM 5000000 UNITS IJ SOLR
5.0000 10*6.[IU] | Freq: Once | INTRAVENOUS | Status: AC
  Administered 2016-08-05: 5 10*6.[IU] via INTRAVENOUS
  Filled 2016-08-05: qty 5

## 2016-08-05 MED ORDER — FENTANYL CITRATE (PF) 100 MCG/2ML IJ SOLN
100.0000 ug | INTRAMUSCULAR | Status: DC | PRN
  Administered 2016-08-05: 100 ug via INTRAVENOUS
  Filled 2016-08-05: qty 2

## 2016-08-05 MED ORDER — PHENYLEPHRINE 40 MCG/ML (10ML) SYRINGE FOR IV PUSH (FOR BLOOD PRESSURE SUPPORT)
80.0000 ug | PREFILLED_SYRINGE | INTRAVENOUS | Status: DC | PRN
  Filled 2016-08-05: qty 5

## 2016-08-05 MED ORDER — OXYTOCIN 40 UNITS IN LACTATED RINGERS INFUSION - SIMPLE MED
2.5000 [IU]/h | INTRAVENOUS | Status: DC
  Filled 2016-08-05: qty 1000

## 2016-08-05 MED ORDER — OXYCODONE-ACETAMINOPHEN 5-325 MG PO TABS: 2.0000 | ORAL_TABLET | ORAL | Status: DC | PRN

## 2016-08-05 MED ORDER — OXYCODONE-ACETAMINOPHEN 5-325 MG PO TABS: 1.0000 | ORAL_TABLET | ORAL | Status: DC | PRN

## 2016-08-05 MED ORDER — ACETAMINOPHEN 325 MG PO TABS: 650.0000 mg | ORAL_TABLET | ORAL | Status: DC | PRN

## 2016-08-05 MED ORDER — EPHEDRINE 5 MG/ML INJ
10.0000 mg | INTRAVENOUS | Status: DC | PRN
  Filled 2016-08-05: qty 4

## 2016-08-05 MED ORDER — PENICILLIN G POTASSIUM 5000000 UNITS IJ SOLR
2.5000 10*6.[IU] | INTRAVENOUS | Status: DC
  Administered 2016-08-06 (×2): 2.5 10*6.[IU] via INTRAVENOUS
  Filled 2016-08-05 (×4): qty 2.5

## 2016-08-05 MED ORDER — LIDOCAINE HCL (PF) 1 % IJ SOLN
INTRAMUSCULAR | Status: DC | PRN
  Administered 2016-08-05 (×2): 5 mL

## 2016-08-05 MED ORDER — FENTANYL 2.5 MCG/ML BUPIVACAINE 1/10 % EPIDURAL INFUSION (WH - ANES)
14.0000 mL/h | INTRAMUSCULAR | Status: DC | PRN
  Administered 2016-08-05 – 2016-08-06 (×3): 14 mL/h via EPIDURAL
  Filled 2016-08-05 (×2): qty 125

## 2016-08-05 MED ORDER — LACTATED RINGERS IV SOLN
INTRAVENOUS | Status: DC
  Administered 2016-08-05: 22:00:00 via INTRAVENOUS

## 2016-08-05 MED ORDER — PHENYLEPHRINE 40 MCG/ML (10ML) SYRINGE FOR IV PUSH (FOR BLOOD PRESSURE SUPPORT)
80.0000 ug | PREFILLED_SYRINGE | INTRAVENOUS | Status: DC | PRN
  Filled 2016-08-05: qty 5
  Filled 2016-08-05: qty 10

## 2016-08-05 MED ORDER — OXYTOCIN BOLUS FROM INFUSION
500.0000 mL | Freq: Once | INTRAVENOUS | Status: AC
  Administered 2016-08-06: 500 mL via INTRAVENOUS

## 2016-08-05 MED ORDER — FLEET ENEMA 7-19 GM/118ML RE ENEM: 1.0000 | ENEMA | RECTAL | Status: DC | PRN

## 2016-08-05 MED ORDER — DIPHENHYDRAMINE HCL 50 MG/ML IJ SOLN: 12.5000 mg | INTRAMUSCULAR | Status: DC | PRN

## 2016-08-05 MED ORDER — LACTATED RINGERS IV SOLN
500.0000 mL | INTRAVENOUS | Status: DC | PRN
  Administered 2016-08-05: 250 mL via INTRAVENOUS

## 2016-08-05 NOTE — Telephone Encounter (Signed)
Preadmission screen  

## 2016-08-05 NOTE — Anesthesia Procedure Notes (Signed)
Epidural Patient location during procedure: OB  Staffing Anesthesiologist: Chardai Gangemi Performed: anesthesiologist   Preanesthetic Checklist Completed: patient identified, site marked, surgical consent, pre-op evaluation, timeout performed, IV checked, risks and benefits discussed and monitors and equipment checked  Epidural Patient position: sitting Prep: DuraPrep Patient monitoring: heart rate, continuous pulse ox and blood pressure Approach: midline Location: L3-L4 Injection technique: LOR saline  Needle:  Needle type: Tuohy  Needle gauge: 17 G Needle length: 9 cm and 9 Needle insertion depth: 6 cm Catheter type: closed end flexible Catheter size: 20 Guage Catheter at skin depth: 10 cm Test dose: negative  Assessment Events: blood not aspirated, injection not painful, no injection resistance, negative IV test and no paresthesia  Additional Notes Patient identified. Risks/Benefits/Options discussed with patient including but not limited to bleeding, infection, nerve damage, paralysis, failed block, incomplete pain control, headache, blood pressure changes, nausea, vomiting, reactions to medication both or allergic, itching and postpartum back pain. Confirmed with bedside nurse the patient's most recent platelet count. Confirmed with patient that they are not currently taking any anticoagulation, have any bleeding history or any family history of bleeding disorders. Patient expressed understanding and wished to proceed. All questions were answered. Sterile technique was used throughout the entire procedure. Please see nursing notes for vital signs. Test dose was given through epidural needle and negative prior to continuing to dose epidural or start infusion. Warning signs of high block given to the patient including shortness of breath, tingling/numbness in hands, complete motor block, or any concerning symptoms with instructions to call for help. Patient was given instructions  on fall risk and not to get out of bed. All questions and concerns addressed with instructions to call with any issues.     

## 2016-08-05 NOTE — Anesthesia Preprocedure Evaluation (Signed)

## 2016-08-06 ENCOUNTER — Encounter (HOSPITAL_COMMUNITY): Payer: Self-pay | Admitting: *Deleted

## 2016-08-06 DIAGNOSIS — O99824 Streptococcus B carrier state complicating childbirth: Secondary | ICD-10-CM

## 2016-08-06 DIAGNOSIS — O4202 Full-term premature rupture of membranes, onset of labor within 24 hours of rupture: Secondary | ICD-10-CM

## 2016-08-06 DIAGNOSIS — Z3A38 38 weeks gestation of pregnancy: Secondary | ICD-10-CM

## 2016-08-06 LAB — TYPE AND SCREEN
ABO/RH(D): O POS
Antibody Screen: NEGATIVE

## 2016-08-06 LAB — ABO/RH: ABO/RH(D): O POS

## 2016-08-06 LAB — RPR: RPR Ser Ql: NONREACTIVE

## 2016-08-06 MED ORDER — COCONUT OIL OIL
1.0000 "application " | TOPICAL_OIL | Status: DC | PRN
  Administered 2016-08-07: 1 via TOPICAL
  Filled 2016-08-06: qty 120

## 2016-08-06 MED ORDER — PRENATAL MULTIVITAMIN CH
1.0000 | ORAL_TABLET | Freq: Every day | ORAL | Status: DC
  Administered 2016-08-07: 1 via ORAL
  Filled 2016-08-06 (×2): qty 1

## 2016-08-06 MED ORDER — DIBUCAINE 1 % RE OINT: 1.0000 "application " | TOPICAL_OINTMENT | RECTAL | Status: DC | PRN

## 2016-08-06 MED ORDER — SENNOSIDES-DOCUSATE SODIUM 8.6-50 MG PO TABS
2.0000 | ORAL_TABLET | ORAL | Status: DC
  Administered 2016-08-07 (×2): 2 via ORAL
  Filled 2016-08-06 (×2): qty 2

## 2016-08-06 MED ORDER — BENZOCAINE-MENTHOL 20-0.5 % EX AERO
1.0000 "application " | INHALATION_SPRAY | CUTANEOUS | Status: DC | PRN
  Administered 2016-08-06: 1 via TOPICAL
  Filled 2016-08-06: qty 56

## 2016-08-06 MED ORDER — ONDANSETRON HCL 4 MG/2ML IJ SOLN: 4.0000 mg | INTRAMUSCULAR | Status: DC | PRN

## 2016-08-06 MED ORDER — ZOLPIDEM TARTRATE 5 MG PO TABS: 5.0000 mg | ORAL_TABLET | Freq: Every evening | ORAL | Status: DC | PRN

## 2016-08-06 MED ORDER — DIPHENHYDRAMINE HCL 25 MG PO CAPS: 25.0000 mg | ORAL_CAPSULE | Freq: Four times a day (QID) | ORAL | Status: DC | PRN

## 2016-08-06 MED ORDER — OXYCODONE HCL 5 MG PO TABS
5.0000 mg | ORAL_TABLET | ORAL | Status: DC | PRN
Start: 2016-08-06 — End: 2016-08-08

## 2016-08-06 MED ORDER — SERTRALINE HCL 50 MG PO TABS
50.0000 mg | ORAL_TABLET | Freq: Every day | ORAL | Status: DC
  Administered 2016-08-07: 50 mg via ORAL
  Filled 2016-08-06 (×2): qty 1

## 2016-08-06 MED ORDER — ONDANSETRON HCL 4 MG PO TABS: 4.0000 mg | ORAL_TABLET | ORAL | Status: DC | PRN

## 2016-08-06 MED ORDER — TETANUS-DIPHTH-ACELL PERTUSSIS 5-2.5-18.5 LF-MCG/0.5 IM SUSP: 0.5000 mL | Freq: Once | INTRAMUSCULAR | Status: DC

## 2016-08-06 MED ORDER — IBUPROFEN 600 MG PO TABS
600.0000 mg | ORAL_TABLET | Freq: Four times a day (QID) | ORAL | Status: DC
  Administered 2016-08-06 – 2016-08-08 (×8): 600 mg via ORAL
  Filled 2016-08-06 (×9): qty 1

## 2016-08-06 MED ORDER — WITCH HAZEL-GLYCERIN EX PADS: 1.0000 "application " | MEDICATED_PAD | CUTANEOUS | Status: DC | PRN

## 2016-08-06 MED ORDER — ACETAMINOPHEN 325 MG PO TABS: 650.0000 mg | ORAL_TABLET | ORAL | Status: DC | PRN

## 2016-08-06 MED ORDER — SIMETHICONE 80 MG PO CHEW: 80.0000 mg | CHEWABLE_TABLET | ORAL | Status: DC | PRN

## 2016-08-06 NOTE — Progress Notes (Signed)
Resident notified of SBP >140 times 2.  No other s/s. Will continue to monitor closely.

## 2016-08-06 NOTE — Progress Notes (Signed)
robb at bedside

## 2016-08-06 NOTE — H&P (Signed)
LABOR AND DELIVERY ADMISSION HISTORY AND PHYSICAL NOTE  Sierra Morrison is a 35 y.o. female G3P0020 with IUP at [redacted]w[redacted]d by IVF treatment presenting for SROM/SOL.   Patient states she noted a big gush of fluid around 8:30pm, greenish colored, shortly after started having very strong contractions. Came to MAU found to be grossly ruptured and in labor.   She reports positive fetal movement. She denies vaginal bleeding.  Prenatal History/Complications:  IVF conception; Elevated AFP; Velamentous cord insertion with succenturiate lobe; GBS+  Past Medical History: Past Medical History:  Diagnosis Date  . Anxiety   . Depression   . Endometriosis   . History of abnormal cervical Pap smear   . Infertility, female   . Left ovarian cyst   . Wears glasses     Past Surgical History: Past Surgical History:  Procedure Laterality Date  . LAPAROSCOPIC OVARIAN CYSTECTOMY Left 03/25/2015   Procedure: LAPAROSCOPIC GELPORT ASSISTED LEFT OVARIAN CYSTECTOMY/EXCISION AND ABLATION OF ENDOMETROSIS/LYSIS OF ADHESIONS/CHROMOTUBATION;  Surgeon: Fermin Schwab, MD;  Location: [facility] ;  Service: Gynecology;  Laterality: Left;  . MOLE REMOVAL  2003   spit nevi  . NASAL SEPTUM SURGERY  2007  . WISDOM TOOTH EXTRACTION  age 35    Obstetrical History: OB History    Gravida Para Term Preterm AB Living   3 0 0 0 2 0   SAB TAB Ectopic Multiple Live Births   2 0 0 0        Social History: Social History   Social History  . Marital status: Married    Spouse name: N/A  . Number of children: N/A  . Years of education: N/A   Occupational History  . admission coordinator [employer]   Social History Main Topics  . Smoking status: Never Smoker  . Smokeless tobacco: Never Used  . Alcohol use 0.6 oz/week    1 Glasses of wine per week     Comment: occassionally  . Drug use: No  . Sexual activity: Yes    Partners: Male    Birth control/ protection: None   Other Topics  Concern  . None   Social History Narrative  . None    Family History: Family History  Problem Relation Age of Onset  . Cancer Mother     breast/skin  . Cancer Maternal Grandmother     breast  . Cancer Maternal Aunt     breast    Allergies: No Known Allergies  Prescriptions Prior to Admission  Medication Sig Dispense Refill Last Dose  . acetaminophen (TYLENOL) 325 MG tablet Take 650 mg by mouth every 6 (six) hours as needed for mild pain, moderate pain or headache.   Past Month at Unknown time  . Prenatal MV-Min-FA-Omega-3 (PRENATAL GUMMIES/DHA & FA) 0.4-32.5 MG CHEW Chew 2 each by mouth at bedtime.   08/03/2016 at Unknown time  . sertraline (ZOLOFT) 50 MG tablet Take 50 mg by mouth at bedtime.   08/03/2016 at Unknown time     Review of Systems   All systems reviewed and negative except as stated in HPI  Blood pressure 127/84, pulse 90, temperature 98.2 F (36.8 C), temperature source Oral, resp. rate 18, height 5\' 2"  (1.575 m), weight 162 lb (73.5 kg), SpO2 100 %. General appearance: alert, cooperative, appears stated age and no distress Lungs: clear to auscultation bilaterally Heart: regular rate and rhythm Abdomen: soft, non-tender; bowel sounds normal Extremities: No calf swelling or tenderness Presentation: cephalic Fetal monitoring: 125 bpm, mod  var, +accels, no decels Uterine activity: q2-3 min Dilation: 5 Effacement (%): 100 Station: -1, 0 Exam by:: dr. Omer Jackmumaw   Prenatal labs: ABO, Rh: O/POS/-- (04/03 1457) Antibody: NEG (04/03 1457) Rubella: !Error! IMMUNE RPR: NON REAC (08/08 1413)  HBsAg: NEGATIVE (04/03 1457)  HIV: NONREACTIVE (08/08 1413)  GBS: Positive (04/03 0000)  1 hr Glucola: 86 Genetic screening:  Abnormal, AFP elevated, declined amio Anatomy US: Normal, female  Prenatal Transfer Tool  Maternal Diabetes: No Genetic Screening: Abnormal:  Results: Elevated AFP Maternal Ultrasounds/Referrals: Abnormal:  Findings:   Other:Placental  abnormalities Fetal Ultrasounds or other Referrals:  None Maternal Substance Abuse:  No Significant Maternal Medications:  Meds include: Zoloft Significant Maternal Lab Results: Lab values include: Group B Strep positive  Results for orders placed or performed during the hospital encounter of 08/05/16 (from the past 24 hour(s))  Fern Test   Collection Time: 08/05/16 10:08 PM  Result Value Ref Range   POCT Fern Test Positive = ruptured amniotic membanes   CBC   Collection Time: 08/05/16 10:10 PM  Result Value Ref Range   WBC 15.8 (H) 4.0 - 10.5 K/uL   RBC 4.09 3.87 - 5.11 MIL/uL   Hemoglobin 10.4 (L) 12.0 - 15.0 g/dL   HCT 98.132.7 (L) 19.136.0 - 47.846.0 %   MCV 80.0 78.0 - 100.0 fL   MCH 25.4 (L) 26.0 - 34.0 pg   MCHC 31.8 30.0 - 36.0 g/dL   RDW 29.516.1 (H) 62.111.5 - 30.815.5 %   Platelets 209 150 - 400 K/uL    Patient Active Problem List   Diagnosis Date Noted  . Indication for care or intervention related to labor and delivery 08/05/2016  . 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 Abnormal findings on antenatal screening   . GBS bacteriuria 01/22/2016  . Endometriosis 01/18/2016  . Supervision of low-risk first pregnancy 01/18/2016  . Pregnancy 01/04/2016  . 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 Anxiety 10/25/2011  . Depression 10/25/2011  . Family history of breast cancer in first degree relative 10/25/2011    Assessment: Sierra Morrison is a 35 y.o. G3P0020 at 7178w3d here for SROM/SOL, with known IVF baby and velamentous insertion with succenturiate lobe of umbilical cord.   #Labor: SROM/SOL, natural #Pain:  Epidural #FWB:  Cat I #ID:   GBS+, PCN #MOF:  Breast #MOC: Depo vs POP #Circ:  Desires inpatient  [facility] CenterElizabeth Tamakia Porto, DO 08/06/2016, 12:17 AM

## 2016-08-06 NOTE — Anesthesia Postprocedure Evaluation (Signed)
Anesthesia Post Note  Patient: Buyer, retailKristen Guhl  Procedure(s) Performed: * No procedures listed *  Patient location during evaluation: Mother Baby Anesthesia Type: Epidural Level of consciousness: awake, awake and alert, oriented and patient cooperative Pain management: pain level controlled Vital Signs Assessment: post-procedure vital signs reviewed and stable Respiratory status: spontaneous breathing, nonlabored ventilation and respiratory function stable Cardiovascular status: stable Postop Assessment: patient able to bend at knees, no headache, no signs of nausea or vomiting, no backache and epidural receding Anesthetic complications: no     Last Vitals:  Vitals:   08/06/16 0917 08/06/16 1130  BP:  (!) 143/79  Pulse: (!) 104 63  Resp:  18  Temp:  37.5 C    Last Pain:  Vitals:   08/06/16 1130  TempSrc: Oral  PainSc: 0-No pain   Pain Goal:                 Taylynn Easton L

## 2016-08-06 NOTE — Lactation Note (Signed)
This note was copied from a baby's chart. Lactation Consultation Note  Patient Name: Sierra Morrison ONGEX'BToday's Date: 08/06/2016 Reason for consult: Initial assessment Baby at 11 hr of life. Mom reports baby "finally latched about an hour ago". She does not think baby had a deep latch but did not want to move him because this was the first time "he actually sucked". Discussed the importance of a good latch at each feeding and wrote lactation number on white board for mom to call at next feeding. Discussed baby behavior, feeding frequency, baby belly size, voids, wt loss, breast changes, and nipple care. She stated she can manually express and has seen colostrum. Given more spoons because she can not find the one that was on the book. Given lactation handouts. Aware of OP services and support group.    Maternal Data Has patient been taught Hand Expression?: Yes Does the patient have breastfeeding experience prior to this delivery?: No  Feeding Feeding Type: Breast Fed Length of feed: 40 min  LATCH Score/Interventions Latch: Repeated attempts needed to sustain latch, nipple held in mouth throughout feeding, stimulation needed to elicit sucking reflex. Intervention(s): Assist with latch  Audible Swallowing: A few with stimulation Intervention(s): Skin to skin  Type of Nipple: Everted at rest and after stimulation  Comfort (Breast/Nipple): Soft / non-tender     Hold (Positioning): Assistance needed to correctly position infant at breast and maintain latch. Intervention(s): Skin to skin  LATCH Score: 7  Lactation Tools Discussed/Used WIC Program: No   Consult Status Consult Status: Follow-up Date: 08/07/16 Follow-up type: In-patient    Sierra Morrison 08/06/2016, 8:35 PM

## 2016-08-06 NOTE — Lactation Note (Signed)
This note was copied from a baby's chart. Lactation Consultation Note  Patient Name: Sierra Wendie SimmerKristen Dutch ONGEX'BToday's Date: 08/06/2016 Reason for consult: Follow-up assessment Baby at 13 hr of life. Mom requested latch help. The L nipple appears to have a bruise on the nipple surface, no skin break down noted. Mom is reporting pain of an 7 when baby is latched. Applied #20 NS but the pain remained a 6-7. When baby came off there was dark brown blood in the NS. Discussed "rusty pipes" with mom. Mom was able to latch baby to the R breast with pain of 3-4 and no NS. Given Harmony to post if she continue to use the NS. Reviewed positioning options. Encouraged mom to call for help as needed.   Maternal Data Has patient been taught Hand Expression?: Yes Does the patient have breastfeeding experience prior to this delivery?: No  Feeding Feeding Type: Breast Fed Length of feed: 20 min  LATCH Score/Interventions Latch: Repeated attempts needed to sustain latch, nipple held in mouth throughout feeding, stimulation needed to elicit sucking reflex. Intervention(s): Adjust position;Assist with latch;Breast massage;Breast compression  Audible Swallowing: A few with stimulation Intervention(s): Alternate breast massage;Hand expression;Skin to skin  Type of Nipple: Everted at rest and after stimulation  Comfort (Breast/Nipple): Filling, red/small blisters or bruises, mild/mod discomfort  Problem noted: Mild/Moderate discomfort;Cracked, bleeding, blisters, bruises;Severe discomfort Interventions  (Cracked/bleeding/bruising/blister): Expressed breast milk to nipple Interventions (Mild/moderate discomfort): Post-pump;Pre-pump if needed;Hand massage  Hold (Positioning): Full assist, staff holds infant at breast Intervention(s): Support Pillows;Position options  LATCH Score: 5  Lactation Tools Discussed/Used Tools: Nipple Shields Nipple shield size: 20 WIC Program: No   Consult Status Consult  Status: Follow-up Date: 08/07/16 Follow-up type: In-patient    Sierra Morrison 08/06/2016, 11:14 PM

## 2016-08-07 NOTE — Progress Notes (Signed)
Post Partum Day 1 Subjective: no complaints, up ad lib, voiding and tolerating PO  Objective: Blood pressure 122/72, pulse 71, temperature 98 F (36.7 C), temperature source Oral, resp. rate 18, height 5\' 2"  (1.575 m), weight 162 lb (73.5 kg), SpO2 100 %, unknown if currently breastfeeding.  Physical Exam:  General: alert, cooperative, appears stated age and no distress Lochia: appropriate Uterine Fundus: firm Incision: healing well, no dehiscence DVT Evaluation: No evidence of DVT seen on physical exam.   Recent Labs  08/04/16 1818 08/05/16 2210  HGB 10.2* 10.4*  HCT 32.6* 32.7*    Assessment/Plan: Plan for discharge tomorrow   LOS: 2 days   Sierra Morrison 08/07/2016, 8:50 AM

## 2016-08-07 NOTE — Lactation Note (Signed)
This note was copied from a baby's chart. Lactation Consultation Note  Patient Name: Sierra Morrison WUJWJ'XToday's Date: 08/07/2016 Reason for consult: Follow-up assessment;Breast/nipple pain Mom requesting latch assist.  She is anxious about how breastfeeding is going and concerned about soreness and small bruise on left areola.  Mom states she has been using cross cradle hold.  Recommended football hold for better control of latch.  Nipples slightly reddened but intact.  Assisted with positioning baby in football hold on right breast.  Transitional milk easily expressed.  Baby opened wide and latched easily.   Latch with good depth and lips flanged.  Mom comfortable with feeding.  Baby nursed actively with many swallows.  Mouth wet when baby came off and nipple round.  Much teaching and reassurance given.  Questions answered.  Encouraged to call for assist/concerns.  Maternal Data    Feeding Feeding Type: Breast Fed Length of feed: 20 min  LATCH Score/Interventions Latch: Grasps breast easily, tongue down, lips flanged, rhythmical sucking. Intervention(s): Adjust position;Assist with latch;Breast massage;Breast compression  Audible Swallowing: Spontaneous and intermittent Intervention(s): Hand expression;Alternate breast massage  Type of Nipple: Everted at rest and after stimulation  Comfort (Breast/Nipple): Filling, red/small blisters or bruises, mild/mod discomfort  Problem noted: Cracked, bleeding, blisters, bruises;Mild/Moderate discomfort  Hold (Positioning): Assistance needed to correctly position infant at breast and maintain latch. Intervention(s): Breastfeeding basics reviewed;Support Pillows;Position options  LATCH Score: 8  Lactation Tools Discussed/Used     Consult Status Consult Status: Follow-up Date: 08/08/16 Follow-up type: In-patient    Huston FoleyMOULDEN, Taliya Mcclard S 08/07/2016, 3:13 PM

## 2016-08-07 NOTE — Clinical Social Work Maternal (Signed)
CLINICAL SOCIAL WORK MATERNAL/CHILD NOTE  Patient Details  Name: Sierra Morrison MRN: [id number] Date of Birth: 08/06/2014  Date:  08/07/2016  Clinical Social Worker Initiating Note:  Ferdinand Lango Catrena Vari, MSW, LCSW-A  Date/ Time Initiated:  08/07/16/1421         Child's Name:  Sierra Morrison   Legal Guardian:  Other (Comment) (Not established by court system; MOB and FOB parent collectively )   Need for Interpreter:  None   Date of Referral:  08/07/16     Reason for Referral:  Other (Comment) (MOB hx of anxiety/depression )   Referral Source:  Physician   Address:  [address] [place], [place] [address]  Phone number:  [phone]   Household Members: Self, Spouse   Natural Supports (not living in the home): Immediate Family, Parent, Friends   Medical illustrator Supports:None   Employment:    Type of Work:     Education:      Printmaker   Other Resources:     Cultural/Religious Considerations Which May Impact Care: None reported at this time.   Strengths: Ability to meet basic needs , Compliance with medical plan , Home prepared for child    Risk Factors/Current Problems: None   Cognitive State: Goal Oriented , Insightful , Alert    Mood/Affect: Happy , Bright , Comfortable , Interested    CSW Assessment:CSW met with MOB at bedside to complete assessment. At this time, MOB had a few visitors in the room. MOB was warm and inviting of this writer to enter and proceed with assessment. This Probation officer explained role and reasoning for visit. At this time MOB notes everything is going great and she has no needs. This Probation officer discussed PPD and SIDS. At this time, no other needs addressed or requested. Case closed to this CSW.   CSW Plan/Description: No Further Intervention Required/No Barriers to Discharge    Oda Cogan, MSW, [facility]   Office: [phone]

## 2016-08-08 MED ORDER — IBUPROFEN 600 MG PO TABS: 600.0000 mg | ORAL_TABLET | Freq: Four times a day (QID) | ORAL | 0 refills | Status: DC

## 2016-08-08 NOTE — Discharge Summary (Signed)
OB Discharge Summary  Patient Name: Sierra Morrison DOB: 07-03-1981 MRN: [id number]  Date of admission: 08/05/2016 Delivering MD: Ernestina PennaSCHENK, NICHOLAS MICHAEL   Date of discharge: 08/08/2016  Admitting diagnosis: 38w water broke, ctx 5 min apart Intrauterine pregnancy: 8333w3d     Secondary diagnosis:Active Problems:   Indication for care or intervention related to labor and delivery  Additional problems:none     Discharge diagnosis: Term Pregnancy Delivered                                                                     Post partum procedures:none  Augmentation: none  Complications: None  Hospital course:  Onset of Labor With Vaginal Delivery     35 y.o. yo [id number] at 3233w3d was admitted in Active Labor on 08/05/2016. Patient had an uncomplicated labor course as follows:  Membrane Rupture Time/Date: 8:30 PM ,08/05/2016   Intrapartum Procedures: Episiotomy: None [1]                                         Lacerations:  3rd degree [4]  Patient had a delivery of a Viable infant. 08/06/2016  Information for the patient's newborn:  Adrian PrinceHarshbarger, Boy Leasha [[id number]][[id number]]  Delivery Method: Vaginal, Spontaneous Delivery (Filed from Delivery Summary)    Pateint had an uncomplicated postpartum course.  She is ambulating, tolerating a regular diet, passing flatus, and urinating well. Patient is discharged home in stable condition on 08/08/16.    Physical exam Vitals:   08/06/16 2000 08/06/16 2354 08/07/16 0600 08/07/16 1747  BP: (!) 124/55 (!) 101/56 122/72 127/69  Pulse: (!) 56 67 71 79  Resp: 18  18 18   Temp: 98.4 F (36.9 C)  98 F (36.7 C) 98.3 F (36.8 C)  TempSrc: Oral  Oral Oral  SpO2:      Weight:      Height:       General: alert, cooperative and no distress Lochia: appropriate Uterine Fundus: firm Incision: N/A DVT Evaluation: No evidence of DVT seen on physical exam. Labs: Lab Results  Component Value Date   WBC 15.8 (H) 08/05/2016   HGB 10.4 (L)  08/05/2016   HCT 32.7 (L) 08/05/2016   MCV 80.0 08/05/2016   PLT 209 08/05/2016   CMP Latest Ref Rng & Units 08/04/2016  Glucose 65 - 99 mg/dL 77  BUN 6 - 20 mg/dL 14  Creatinine 1.300.44 - 8.651.00 mg/dL 7.840.81  Sodium 696135 - 295145 mmol/L 137  Potassium 3.5 - 5.1 mmol/L 4.2  Chloride 101 - 111 mmol/L 106  CO2 22 - 32 mmol/L 23  Calcium 8.9 - 10.3 mg/dL 9.1  Total Protein 6.5 - 8.1 g/dL 7.0  Total Bilirubin 0.3 - 1.2 mg/dL 0.6  Alkaline Phos 38 - 126 U/L 240(H)  AST 15 - 41 U/L 25  ALT 14 - 54 U/L 12(L)    Discharge instruction: per After Visit Summary and "Baby and Me Booklet".  After Visit Meds:    Medication List    TAKE these medications   acetaminophen 325 MG tablet Commonly known as:  TYLENOL Take 650 mg by mouth every 6 (six) hours as  needed for mild pain, moderate pain or headache.   ibuprofen 600 MG tablet Commonly known as:  ADVIL,MOTRIN Take 1 tablet (600 mg total) by mouth every 6 (six) hours.   PRENATAL GUMMIES/DHA & FA 0.4-32.5 MG Chew Chew 2 each by mouth at bedtime.   sertraline 50 MG tablet Commonly known as:  ZOLOFT Take 50 mg by mouth at bedtime.       Diet: routine diet  Activity: Advance as tolerated. Pelvic rest for 6 weeks.   Outpatient follow up:6 weeks Follow up Appt:Future Appointments Date Time Provider Department Center  08/08/2016 8:30 AM [facility] NURSE [facility]   Follow up visit: No Follow-up on file.  Postpartum contraception: Depo Provera  Newborn Data: Live born female  Birth Weight: 6 lb 4.5 oz (2850 g) APGAR: 9, 9  Baby Feeding: Breast Disposition:home with mother   08/08/2016 Wyvonnia Dusky, CNM

## 2016-08-08 NOTE — Lactation Note (Signed)
This note was copied from a baby's chart. Lactation Consultation Note  Patient Name: Boy Wendie SimmerKristen Pearlman ZOXWR'UToday's Date: 08/08/2016 Reason for consult: Follow-up assessment;Infant < 6lbs Follow up visit made prior to discharge.  Mom reports she feels much better about the breastfeeding.  Baby cluster fed through the night.  Reassured mom this is temporary and will be helpful to bring milk to volume.  Discharge teaching done including keeping a feeding diary and engorgement treatment.  Questions answered.  Lactation outpatient services and support reviewed and encouraged.  Maternal Data    Feeding Feeding Type: Breast Fed  LATCH Score/Interventions Latch: Grasps breast easily, tongue down, lips flanged, rhythmical sucking.  Audible Swallowing: Spontaneous and intermittent Intervention(s): Alternate breast massage  Type of Nipple: Everted at rest and after stimulation  Comfort (Breast/Nipple): Filling, red/small blisters or bruises, mild/mod discomfort  Problem noted: Mild/Moderate discomfort  Hold (Positioning): No assistance needed to correctly position infant at breast. Intervention(s): Breastfeeding basics reviewed;Support Pillows;Position options;Skin to skin  LATCH Score: 9  Lactation Tools Discussed/Used     Consult Status Consult Status: Complete    Lorrine Killilea S 08/08/2016, 10:03 AM

## 2016-08-09 ENCOUNTER — Inpatient Hospital Stay (HOSPITAL_COMMUNITY): Admission: RE | Admit: 2016-08-09 | Payer: 59 | Source: Ambulatory Visit

## 2016-08-12 ENCOUNTER — Ambulatory Visit (HOSPITAL_COMMUNITY)
Admission: RE | Admit: 2016-08-12 | Discharge: 2016-08-12 | Disposition: A | Discharge status: Home / Self Care | Payer: 59 | Source: Ambulatory Visit | Attending: Obstetrics & Gynecology | Admitting: Obstetrics & Gynecology

## 2016-08-12 NOTE — Lactation Note (Signed)
Lactation Consult Weight today 2782 g  6# 2.1 oz Up 2 oz from Ped visit yesterday. Mom wants to make sure everything is going well. Both nipples scabbed. Sierra Morrison latches well but mom complains of severe pain the first few minutes that then eases off. I question if baby has pos tongue restrictions. Does lift tongue slightly and can extend slightly beyond gumline. Mom has NS she got here from us- does not know how to use it. Reviewed with mom. She correctly placed it on nipple and states pain is less with NS. Encouraged to try it to help nipples to heal. Also needed instruction on use of manual pump. Reviewed setup, use and cleaning. Reviewed with mom when to start pumping for back to work and milk storage guidelines. Praise given for her efforts. No further questions at present. Offered another OP appointment  and agreeable. Scheduled for Thursday 11/4 at 1 pm To call prn  Mother's reason for visit:  Help with breast feeding, lots of pain with latch Visit Type:  Feeding assessmen Consult:  Initial Lactation Consultant:  Pamelia HoitWeeks, Keauna Brasel D  ________________________________________________________________________ Sierra FloresBaby's Name:  Sierra Morrison Date of Birth:  08/06/2014 [facility]  Gender:  female Gestational Age: 3757w3d (At Birth) Birth Weight:  6 lb 4.5 oz (2850 g) Weight at Discharge:  Weight: 5 lb 13.3 oz (2645 g)             Date of Discharge:  08/08/2016      Filed Weights   08/06/16 0932 08/07/16 0011 08/08/16 0000  Weight: 6 lb 4.5 oz (2850 g) 6 lb 1.9 oz (2775 g) 5 lb 13.3 oz (2645 g)      ________________________________________________________________________  Mother's Name: Sierra Morrison  Breastfeeding Experience:  P1 ________________________________________________________________________  Breastfeeding History (Post Discharge)  Frequency of breastfeeding:  q 3 hours, lots during the night Duration of feeding:  10-30 min  Patient does not supplement  or pump.  Infant Intake and Output Assessment  Voids:  8 in 24 hrs.  Color:  Clear yellow Stools:  4 in 24 hrs.  Color:  Brown and Yellow  ________________________________________________________________________  Maternal Breast Assessment  Breast:  Filling Nipple:  Erect, Reddened and Cracked Pain level:  10 with initial latch then eases off to 6-7  _______________________________________________________________________ Feeding Assessment/Evaluation  Initial feeding assessment:  Infant's oral assessment:  Variance  Positioning:  Football Right breast  LATCH documentation:  Latch:  2 = Grasps breast easily, tongue down, lips flanged, rhythmical sucking.  Audible swallowing:  2 = Spontaneous and intermittent  Type of nipple:  2 = Everted at rest and after stimulation  Comfort (Breast/Nipple):  0 = Engorged, cracked, bleeding, large blisters, severe discomfort  Hold (Positioning):  1 = Assistance needed to correctly position infant at breast and maintain latch  LATCH score:  7  Attached assessment:  Deep  Lips flanged:  Yes.    Lips untucked:  Yes.    Suck assessment:  Nutritive  Tools:  Nipple shield 20 mm Instructed on use and cleaning of tool:  Yes.    Pre-feed weight:  2782 g 6#  2.1 oz Post-feed weight:  2812 g  6# 3.2 oz Amount transferred:  30 ml Amount supplemented:  0 ml  [name] latched well but mom complains of pain level of 10 out of 10 when baby first latches on. Her toes are curling and face grimacing.After about 2 min it eases off and she reports it is more tolerable.Breasts are much heavier this  morning. EBM is whitish. Mom reports breast feels softer after nursing. Both nipples with scabs on them.    Pre-feed weight:  2812 g  6# 3.2 oz Post-feed weight:  2830 g 6# 3.8 oz Amount transferred:  18 ml  Total amount pumped post feed:  Assisted mom with using manual pump- she is waiting on her DEBP to come from insurance company Total amount transferred:   48 ml Total supplement given:  0 ml

## 2016-08-17 ENCOUNTER — Inpatient Hospital Stay (HOSPITAL_COMMUNITY): Payer: 59

## 2016-08-18 ENCOUNTER — Ambulatory Visit (HOSPITAL_COMMUNITY)
Admission: RE | Admit: 2016-08-18 | Discharge: 2016-08-18 | Disposition: A | Discharge status: Home / Self Care | Payer: 59 | Source: Ambulatory Visit | Attending: Obstetrics & Gynecology | Admitting: Obstetrics & Gynecology

## 2016-08-18 NOTE — Lactation Note (Signed)
Lactation Consult: weight today 6# 10.3 oz 3014 g up 8 oz in 6 days from last visit here. Nipples are improving. Reviewed basic teaching. Encouragement given. Praise given for her efforts. To call prn  Mother's reason for visit:  Follow up from last week Visit Type: Feeding assessment Appointment Notes:  SN Consult:  Follow-Up Lactation Consultant:  Sierra Morrison, Sierra Morrison  ________________________________________________________________________  Sierra FloresBaby's Name:  Sierra Morrison Date of Birth:  08/06/2014 [facility] [place] Gender:  female Gestational Age: 5387w3d (At Birth) Birth Weight:  6 lb 4.5 oz (2850 g) Weight at Discharge:  Weight: 5 lb 13.3 oz (2645 g)             Date of Discharge:  08/08/2016      Filed Weights   08/06/16 0932 08/07/16 0011 08/08/16 0000  Weight: 6 lb 4.5 oz (2850 g) 6 lb 1.9 oz (2775 g) 5 lb 13.3 oz (2645 g)     ________________________________________________________________________  Mother's Name: Sierra Morrison Type of delivery:  vag Breastfeeding Experience:  P1  ________________________________________________________________________  Breastfeeding History (Post Discharge)  Frequency of breastfeeding:  q 2-3 hours Duration of feeding:  15-30 min, feeds more through the night  Patient does not supplement or pump.  Infant Intake and Output Assessment  Voids:  8-10 in 24 hrs.  Color:  Clear yellow Stools:  5 in 24 hrs.  Color:  Yellow  ________________________________________________________________________  Maternal Breast Assessment  Breast:  Filling Nipple:  Erect  _______________________________________________________________________ Feeding Assessment/Evaluation  Initial feeding assessment:  Infant's oral assessment:  Variance  Positioning:  Football Right breast  LATCH documentation:  Latch:  2 = Grasps breast easily, tongue down, lips flanged, rhythmical sucking.  Audible swallowing:  2 =  Spontaneous and intermittent  Type of nipple:  2 = Everted at rest and after stimulation  Comfort (Breast/Nipple):  1 = Filling, red/small blisters or bruises, mild/mod discomfort  Hold (Positioning):  1 = Assistance needed to correctly position infant at breast and maintain latch  LATCH score:  8  Attached assessment:  Deep  Lips flanged:  Yes.    Lips untucked:  Yes.    Suck assessment:  Nutritive   Pre-feed weight:  3014 g  6# 10.3 oz Post-feed weight:  3060 g 6# 11.9 oz Amount transferred:  46 ml Amount supplemented:  0 ml  Sierra Morrison latched well. Nipples are much improved from last week. Mom reports she is leaning over to baby a lot and her back is getting sore. Encouraged to bring him up to her. Reports this feels much better. Mom notes small crack at base of nipple at about 3:00. No redness noted. Nursed for 15 min with lots of swallows noted and mom reports breast is much softer. Has been using NS some throughout the week- a few days with it and then a few days without. Has been using it again since Tuesday. Latched without it at this feeding. Mom reports only slight pain- much improved from last week.   Pre- feed weight:3060 g  6# 11.9 oz Post-feed weight: 3108 g  6# 13.6 oz Amounts transferred: 48 ml  Sierra Morrison latched well to left breast in football position,.Again assisted mom to bring him up to her and she reports this is more comfortable Lots of swallows noted and mom reports breast is feeling softer. Praise given for her efforts. No further questions at present. Offered another OP appointment but mom feels breast feeding is going better and she will call if needs another  appointment. Reviewed BFSG as another resource for support and weight check. To call prn  Total amount pumped post feed:  Did not pump Total amount transferred:  94 ml Total supplement given:  0 ml

## 2016-08-19 IMAGING — US US MFM OB DETAIL+14 WK
1 series · 14 of 28 positions shown · non-contrast
Comparison: none

[Series 1: us mfm ob detail+14 wk · 83 acquisitions, 14 frames shown]
[im 4/83]
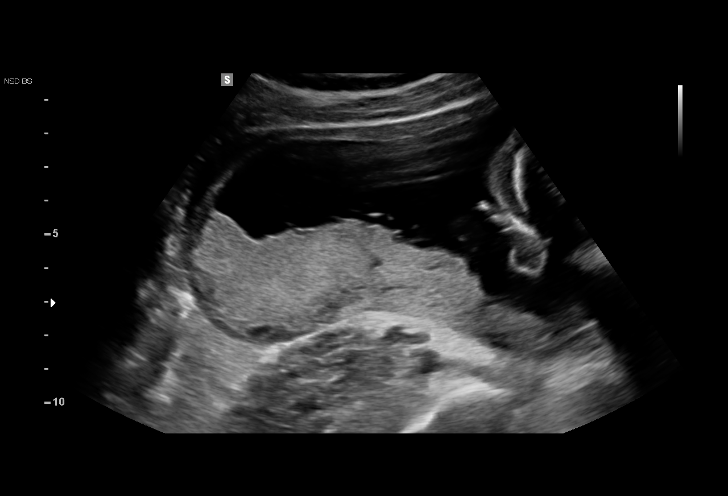
[im 10/83]
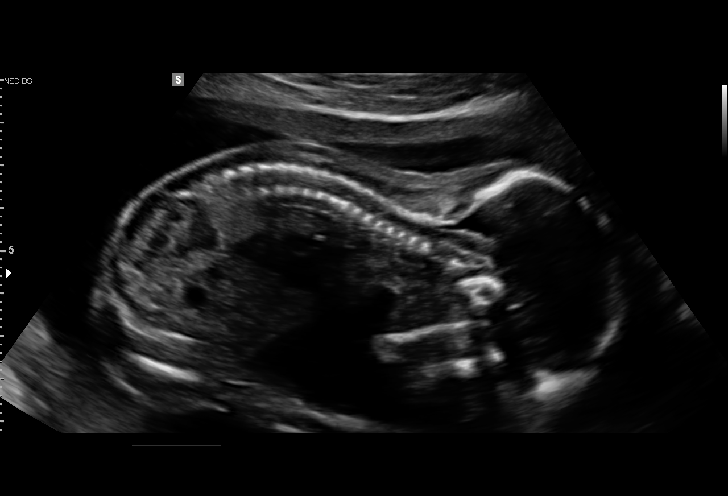
[im 16/83]
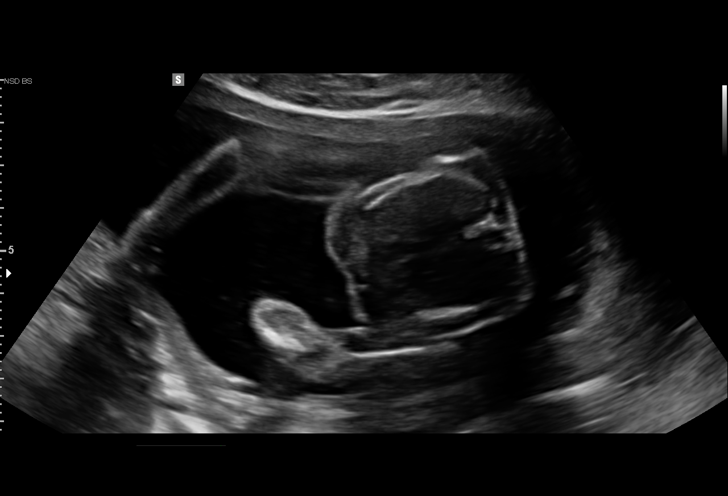
[im 22/83]
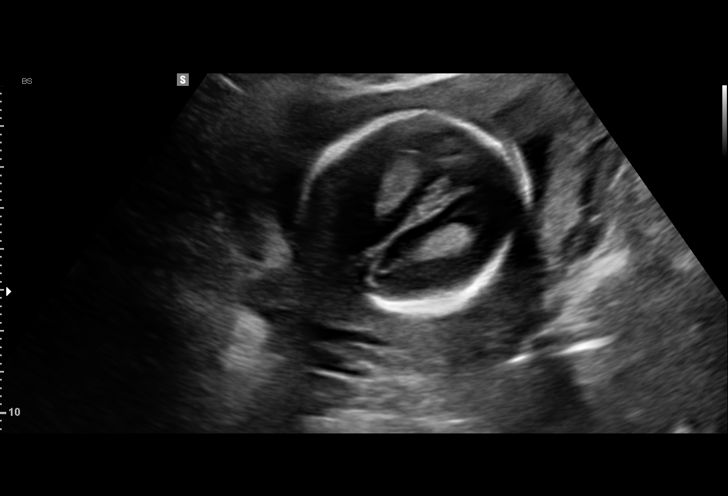
[im 28/83]
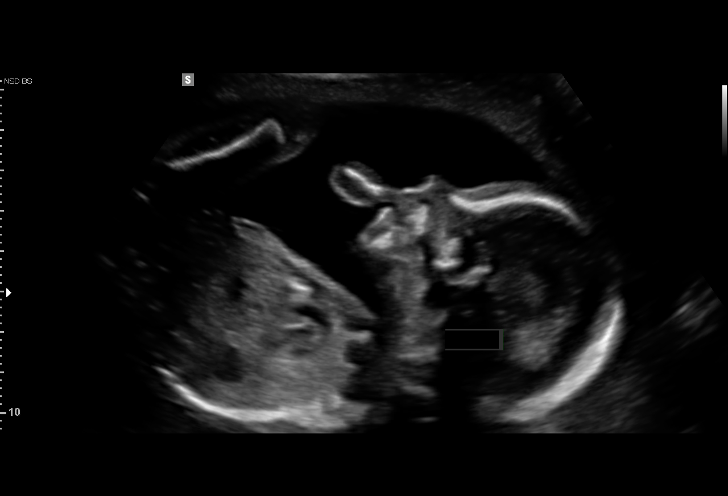
[im 34/83]
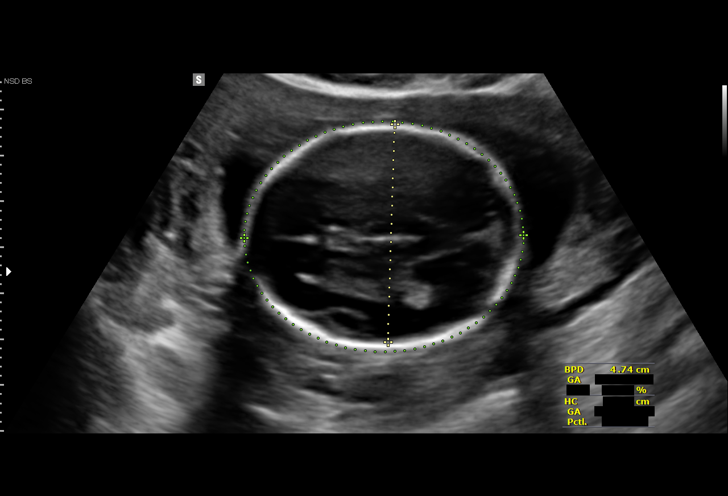
[im 40/83]
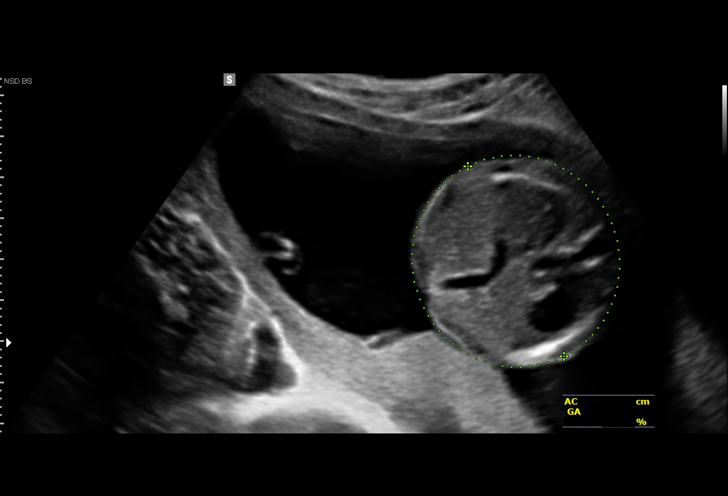
[im 46/83]
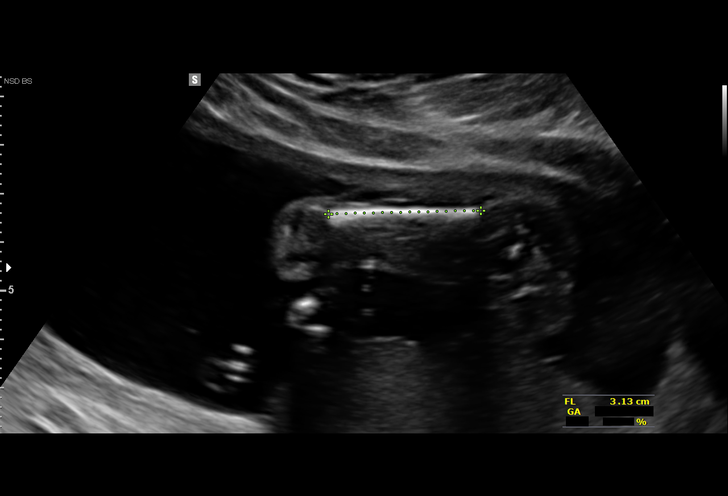
[im 52/83]
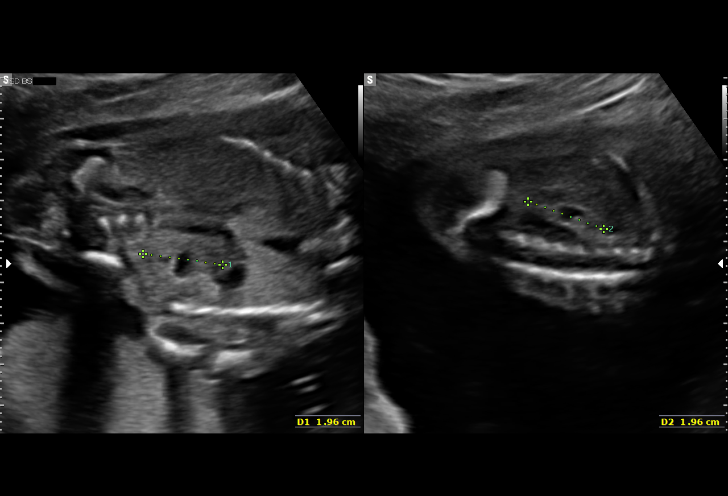
[im 58/83]
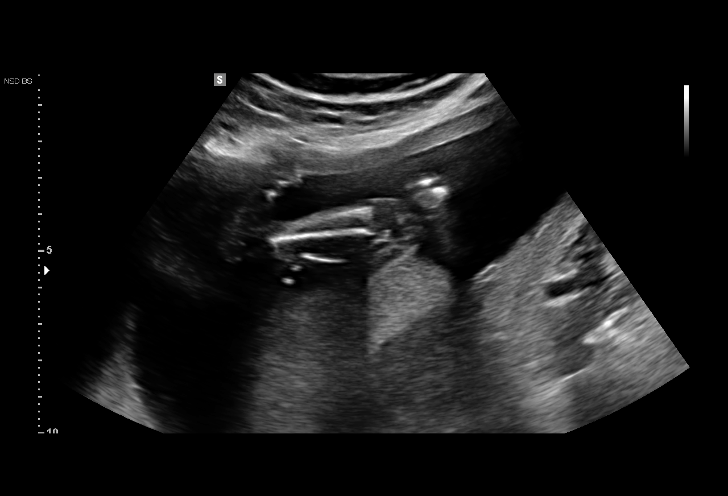
[im 64/83]
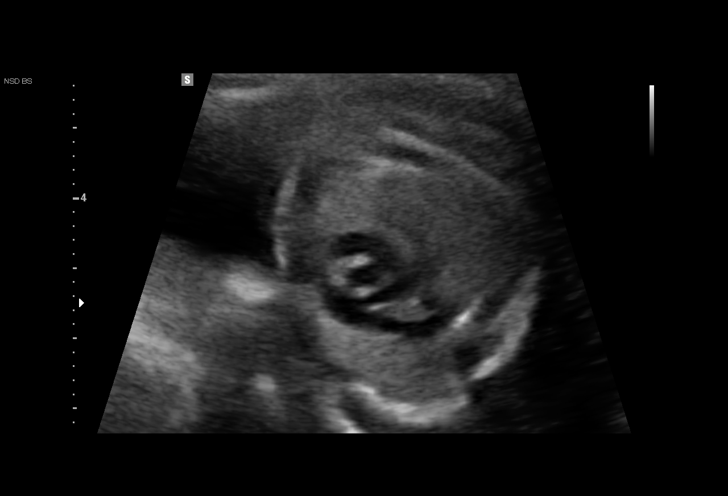
[im 70/83]
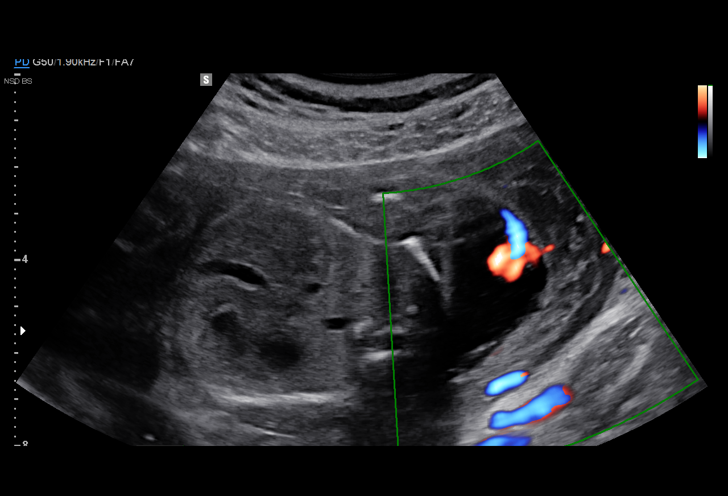
[im 76/83]
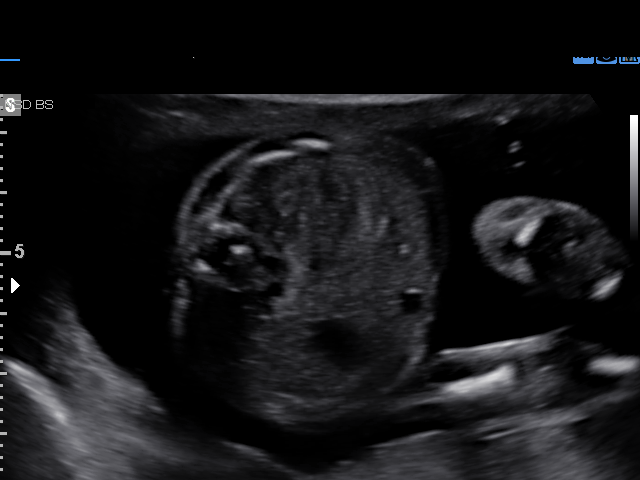
[im 83/83]
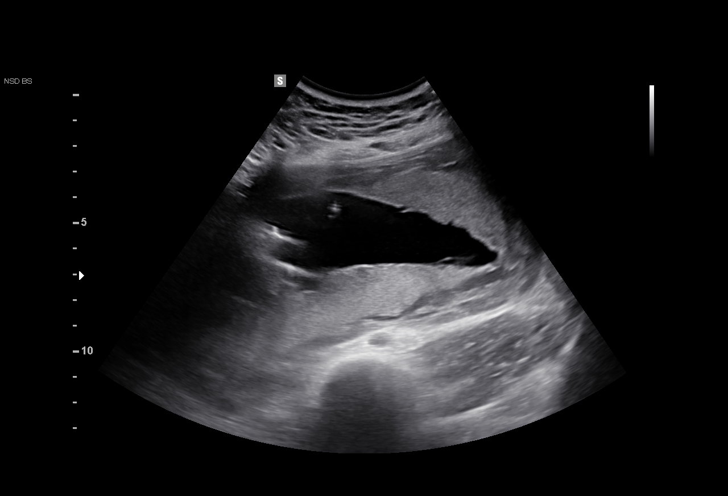

[14 of 28 positions shown; findings below may reference images not displayed]

[REDACTED]-
Faculty Physician

1  KAM BOR ARIYANTI            [id number]      [phone]     [id number]
Indications

20 weeks gestation of pregnancy
Elevated MSAFP (OSB [DATE], 2.9 MoM)
Pregnancy resulting from assisted
reproductive technology (IVF); PGS - low risk
Detailed fetal anatomic survey                 Z36
OB History

Gravidity:    3         Term:   0        Prem:   0        SAB:   2
TOP:          0       Ectopic:  0        Living: 0
Fetal Evaluation

Num Of Fetuses:     1
Fetal Heart         154
Rate(bpm):
Cardiac Activity:   Observed
Presentation:       Cephalic
Placenta:           Posterior with left/anterior succ lobe
P. Cord Insertion:  Velamentous insertion

Amniotic Fluid
AFI FV:      Subjectively within normal limits

Largest Pocket(cm)
4.5
Biometry

BPD:      47.5  mm     G. Age:  20w 2d         65  %    CI:        74.93   %   70 - 86
FL/HC:      18.1   %   16.8 -
HC:      174.1  mm     G. Age:  19w 6d         40  %    HC/AC:      1.10       1.09 -
AC:      158.7  mm     G. Age:  21w 0d         76  %    FL/BPD:     66.3   %
FL:       31.5  mm     G. Age:  19w 6d         35  %    FL/AC:      19.8   %   20 - 24
HUM:      29.6  mm     G. Age:  19w 5d         46  %

Est. FW:     351  gm    0 lb 12 oz      55  %
Gestational Age

U/S Today:     20w 2d                                        EDD:   08/15/16
Best:          20w 0d    Det. By:   Embryo Transfer          EDD:   08/17/16
Anatomy

Cranium:               Appears normal         LVOT:                   Appears normal
Cavum:                 Appears normal         Aortic Arch:            Appears normal
Ventricles:            Appears normal         Ductal Arch:            Appears normal
Choroid Plexus:        Appears normal         Diaphragm:              Appears normal
Cerebellum:            Appears normal         Stomach:                Appears normal, left
sided
Posterior Fossa:       Appears normal         Abdomen:                Appears normal
Nuchal Fold:           Not applicable (>20    Abdominal Wall:         Appears nml (cord
wks GA)                                        insert, abd wall)
Face:                  Appears normal         Cord Vessels:           Appears normal (3
(orbits and profile)                           vessel cord)
Lips:                  Appears normal         Kidneys:                Appear normal
Palate:                Not well visualized    Bladder:                Appears normal
Thoracic:              Appears normal         Spine:                  Appears normal
Heart:                 Appears normal         Upper Extremities:      Appears normal
(4CH, axis, and
situs)
RVOT:                  Appears normal         Lower Extremities:      Appears normal

Other:  Male gender. Heels visualized.
Cervix Uterus Adnexa

Cervix
Length:            3.2  cm.
Normal appearance by transabdominal scan.

Left Ovary
Not visualized.

Right Ovary
Not visualized.

Adnexa:       No abnormality visualized.
Impression

SIUP at 20+0 weeks
Normal detailed fetal anatomy
Markers of aneuploidy: none
Normal amniotic fluid volume
Measurements consistent with conception date
Posterior placenta with left lateral/anterior succinturiate lobe;
velamentous cord insertion site

The US findings were shared with Ms. Shelomi and her
husband.  Fortunately, no structural abnormalities were
identified to explain the elevated MSAFP. Specifically, the
spine, posterior fossa and abdominal wall were seen and
appeared normal. She received genetic counseling. All of
their prenatal testing and pregnancy management options
were reviewed. She declined amniocentesis.
Recommendations

Follow-up ultrasound for growth in 6-8 weeks

## 2016-09-15 ENCOUNTER — Encounter: Payer: Self-pay | Admitting: Obstetrics & Gynecology

## 2016-09-15 ENCOUNTER — Ambulatory Visit (INDEPENDENT_AMBULATORY_CARE_PROVIDER_SITE_OTHER): Payer: 59 | Admitting: Obstetrics & Gynecology

## 2016-09-15 VITALS — BP 118/70 | HR 81 | Ht 63.0 in | Wt 133.0 lb

## 2016-09-15 DIAGNOSIS — K921 Melena: Secondary | ICD-10-CM

## 2016-09-15 NOTE — Progress Notes (Signed)
Post Partum Exam  Sierra Morrison is a 35 y.o. 693P1021 female who presents for a postpartum visit. She is 5 w5d postpartum following a spontaneous vaginal delivery. I have fully reviewed the prenatal and intrapartum course. The delivery was at 4056w3d gestational weeks.  Anesthesia: epidural. Postpartum course has been unrematkable. Baby's course has been unremarkable. Baby is feeding by breast. Bleeding no bleeding. Bowel function is abnormal with hemorrhoids and pain and blood with BMs despite using stool softeners. Bladder function is abnormal with leakage. Patient is not sexually active. Contraception method is OCP Postpartum depression screening:neg  The following portions of the patient's history were reviewed and updated as appropriate: allergies, current medications, past family history, past medical history, past social history, past surgical history and problem list.  Review of Systems Pertinent items are noted in HPI.    Objective:    BP 116/78 mmHg  Pulse 78  Resp 16  Ht 5\' 5"  (1.651 m)  Wt 211 lb (95.709 kg)  BMI 35.11 kg/m2  Breastfeeding? Yes  General:  alert   Breasts:  inspection negative, no nipple discharge or bleeding, no masses or nodularity palpable  Lungs: clear to auscultation bilaterally  Heart:  regular rate and rhythm, S1, S2 normal, no murmur, click, rub or gallop  Abdomen: soft, non-tender; bowel sounds normal; no masses,  no organomegaly   Vulva:  normal  Vagina: normal vagina  Cervix:  anteverted  Corpus: normal  Adnexa:  normal adnexa  Rectal Exam: Not performed.        Assessment:    Normal postpartum exam. Pap smear not done at today's visit.   Plan:   1. Contraception: condoms 2. Hematochezia and dyschezia- refer to GI 3. Follow up in: 1 year/prn sooner or as needed.

## 2016-09-19 ENCOUNTER — Telehealth (HOSPITAL_COMMUNITY): Payer: Self-pay | Admitting: Lactation Services

## 2016-09-19 NOTE — Telephone Encounter (Signed)
Mom called, left message and I called her back. Madelin RearDillon is now 89 [address] S[address] old. Reports that he has been sleeping about 8 hours through the night and wants to make sure that is ok. Feeds every 2-3 hours during the daytime- states she is getting at least 8 feedings/24 hours. Good output. Suggested BFSG to make sure baby is gaining weight,. No further questions at present.

## 2016-09-30 IMAGING — US US MFM OB FOLLOW-UP
1 series · 14 of 28 positions shown · non-contrast
Comparison: none

[Series 1: us mfm ob follow-up · 41 acquisitions, 14 frames shown]
[im 2/41]
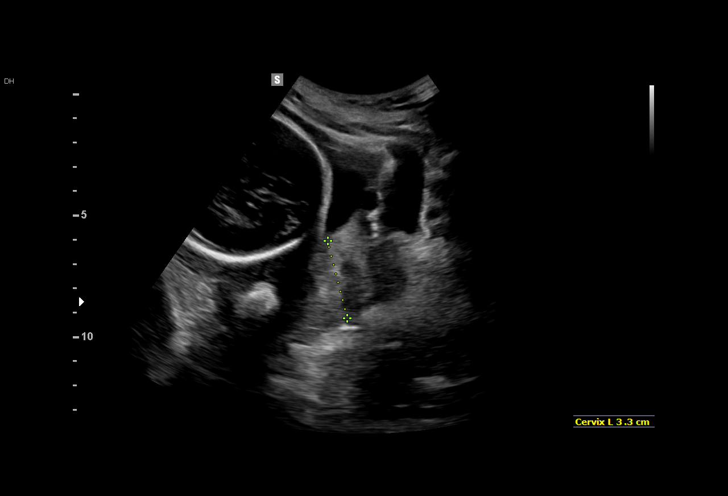
[im 5/41]
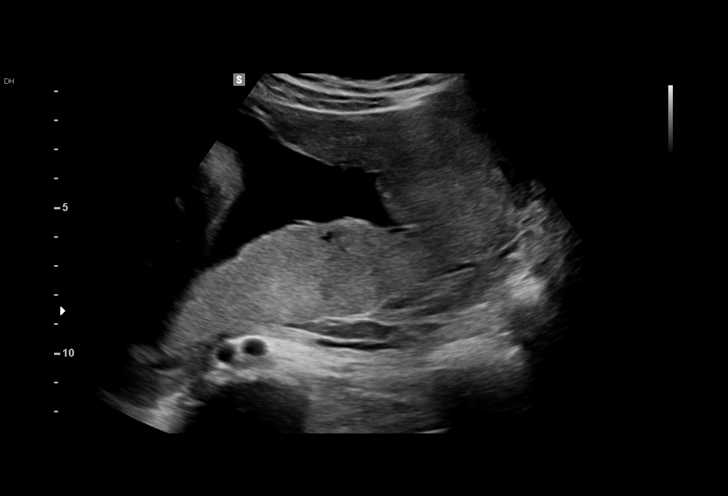
[im 8/41]
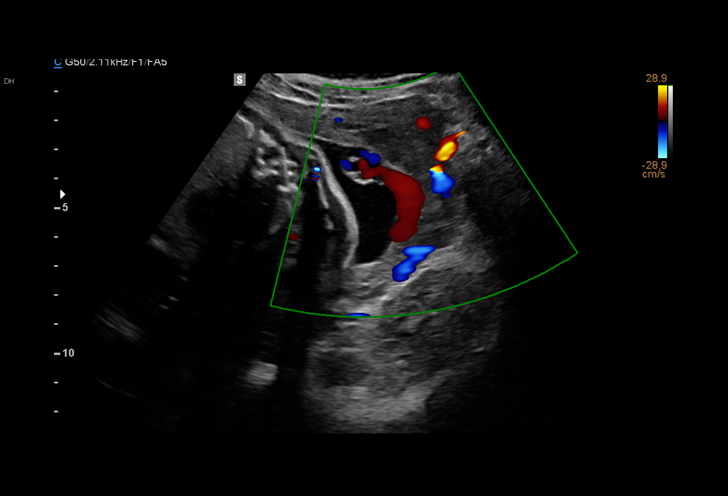
[im 11/41]
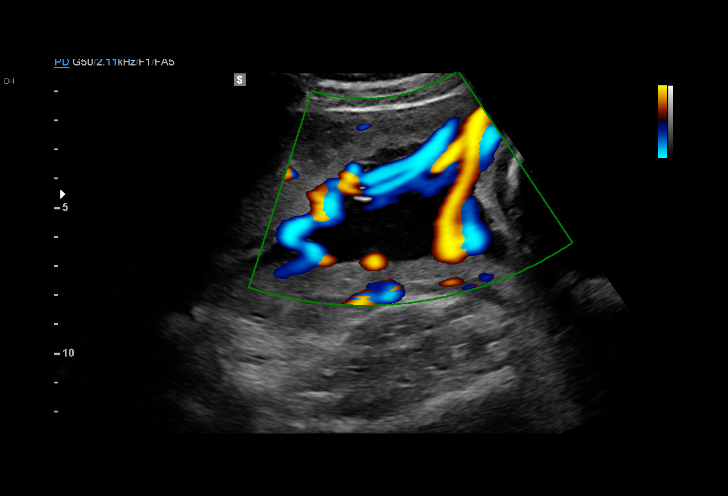
[im 14/41]
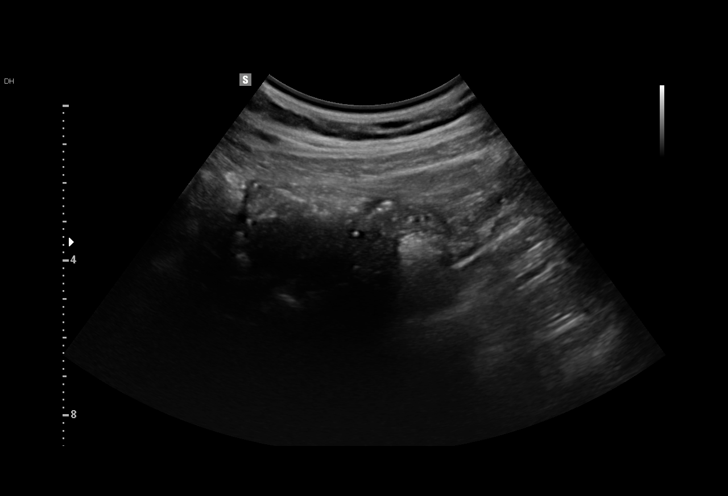
[im 17/41]
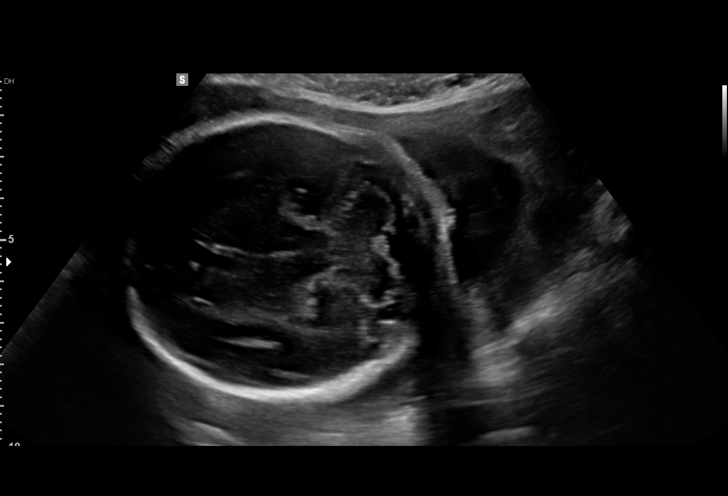
[im 20/41]
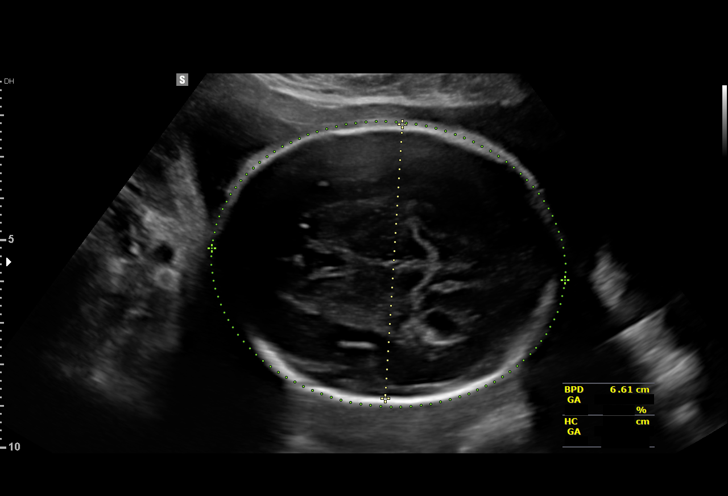
[im 23/41]
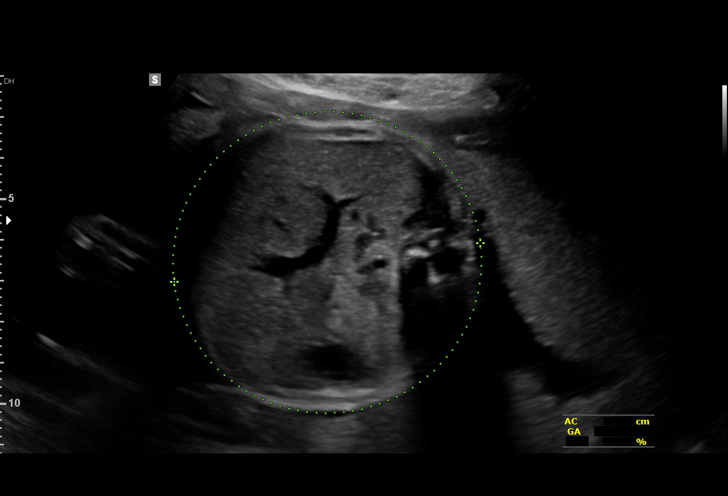
[im 26/41]
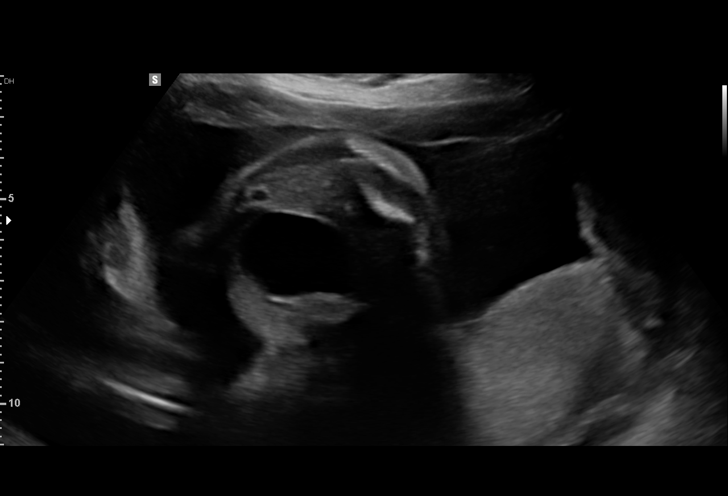
[im 29/41]
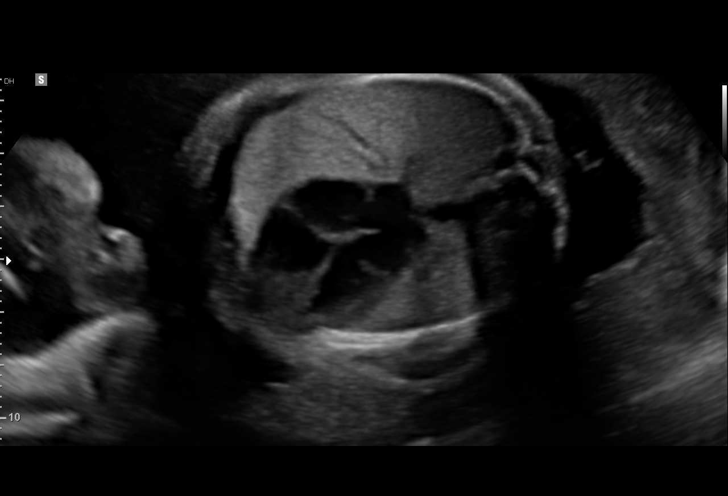
[im 32/41]
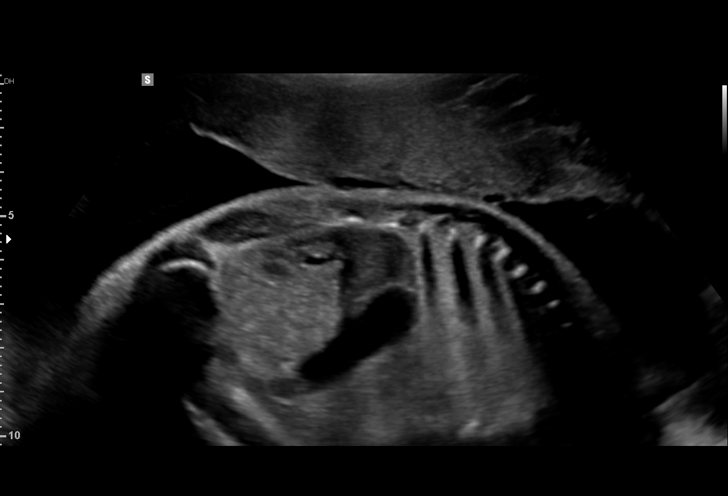
[im 35/41]
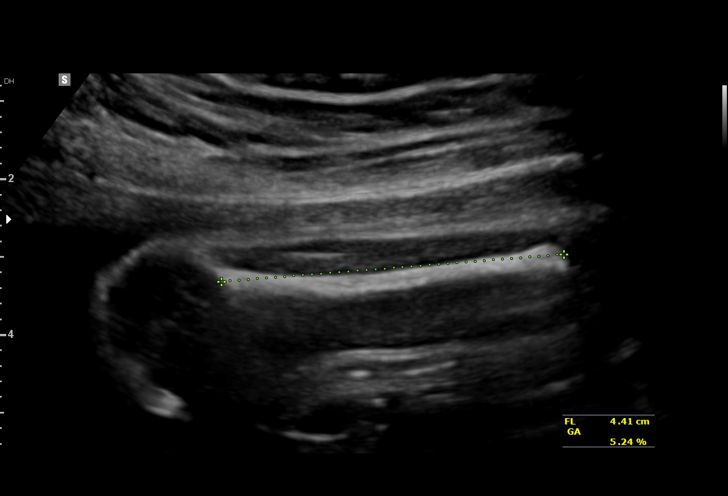
[im 38/41]
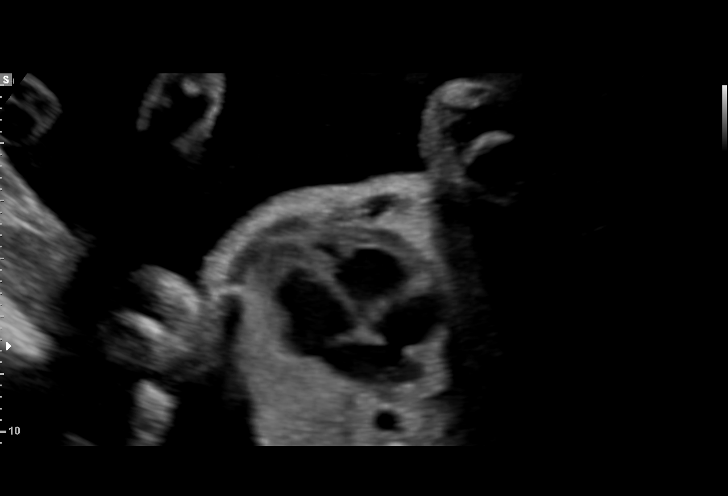
[im 41/41]
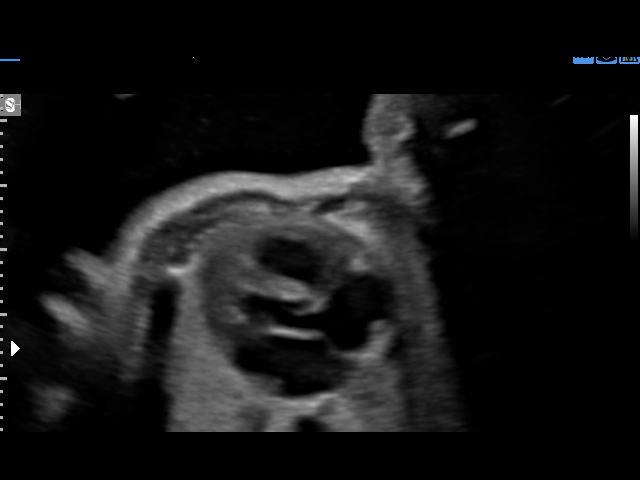

[14 of 28 positions shown; findings below may reference images not displayed]

[REDACTED]-
Faculty Physician

1  JHON ARIEL PRESENTE            [id number]      [phone]     [id number]
Indications

26 weeks gestation of pregnancy
Elevated MSAFP (OSB [DATE], 2.9 MoM)
Pregnancy resulting from assisted
reproductive technology (IVF); PGS - low risk
Velamentous insertion of umbilical cord
OB History

Gravidity:    3         Term:   0        Prem:   0        SAB:   2
TOP:          0       Ectopic:  0        Living: 0
Fetal Evaluation

Num Of Fetuses:     1
Cardiac Activity:   Observed
Presentation:       Cephalic
Placenta:           Posterior with left/anterior succ lobe
P. Cord Insertion:  Velamentous insertion

Amniotic Fluid
AFI FV:      Subjectively within normal limits

Largest Pocket(cm)
5.0
Biometry
BPD:      65.7  mm     G. Age:  26w 4d         58  %    CI:        75.65   %   70 - 86
FL/HC:      18.4   %   18.6 -
HC:      239.5  mm     G. Age:  26w 0d         26  %    HC/AC:      1.02       1.04 -
AC:      233.7  mm     G. Age:  27w 5d         87  %    FL/BPD:     67.1   %   71 - 87
FL:       44.1  mm     G. Age:  24w 4d          6  %    FL/AC:      18.9   %   20 - 24
HUM:      42.6  mm     G. Age:  25w 4d         34  %
CER:      29.7  mm     G. Age:  26w 2d         58  %
CM:        4.4  mm

Est. FW:     927  gm      2 lb 1 oz     61  %
Gestational Age

U/S Today:     26w 2d                                        EDD:   08/15/16
Best:          26w 0d    Det. By:   Embryo Transfer          EDD:   08/17/16
Anatomy

Cranium:               Appears normal         Aortic Arch:            Previously seen
Cavum:                 Appears normal         Ductal Arch:            Previously seen
Ventricles:            Appears normal         Diaphragm:              Previously seen
Choroid Plexus:        Previously seen        Stomach:                Appears normal, left
sided
Cerebellum:            Appears normal         Abdomen:                Appears normal
Posterior Fossa:       Appears normal         Abdominal Wall:         Previously seen
Nuchal Fold:           Not applicable (>20    Cord Vessels:           Previously seen
wks GA)
Face:                  Orbits and profile     Kidneys:                Appear normal
previously seen
Lips:                  Previously seen        Bladder:                Appears normal
Thoracic:              Appears normal         Spine:                  Previously seen
Heart:                 Appears normal         Upper Extremities:      Previously seen
(4CH, axis, and
situs)
RVOT:                  Previously seen        Lower Extremities:      Previously seen
LVOT:                  Previously seen

Other:  Male gender. Heels previously visualized. Nasal bone visualized.
Cervix Uterus Adnexa

Cervix
Length:            3.3  cm.
Normal appearance by transabdominal scan.

Adnexa:       No abnormality visualized.
Impression

Single IUP at 26w 0d
Elevated MSAFP, IVF pregnancy
Normal interval anatomy
The estimtated fetal weight is at the 61st %tile.
Posterior placenta.  A velamentous cord insertion is noted.
Normal amniotic fluid volume
Recommendations

Recommend follow-up ultrasound examination in 4 weeks for
interval growth

## 2016-11-04 ENCOUNTER — Ambulatory Visit (INDEPENDENT_AMBULATORY_CARE_PROVIDER_SITE_OTHER): Payer: 59 | Admitting: Physician Assistant

## 2016-11-04 VITALS — BP 124/84 | HR 72 | Wt 129.0 lb

## 2016-11-04 DIAGNOSIS — N61 Mastitis without abscess: Secondary | ICD-10-CM | POA: Diagnosis not present

## 2016-11-04 MED ORDER — DICLOXACILLIN SODIUM 250 MG PO CAPS: 250.0000 mg | ORAL_CAPSULE | Freq: Four times a day (QID) | ORAL | 0 refills | Status: DC

## 2016-11-04 NOTE — Patient Instructions (Addendum)
Continue to breastfeed and empty the left breast to prevent engorgement Ibuprofen 400-600mg  every 6 hours as needed for pain Start antibiotic if you develop worsening redness, pain, or fever Return if no improvement in 3-4 days  Breastfeeding and Mastitis Mastitis is inflammation of the breast tissue. It can occur in women who are breastfeeding. This can make breastfeeding painful. Mastitis will sometimes go away on its own. Your health care provider will help determine if treatment is needed. CAUSES Mastitis is often associated with a blocked milk (lactiferous) duct. This can happen when too much milk builds up in the breast. Causes of excess milk in the breast can include:  Poor latch-on. If your baby is not latched onto the breast properly, she or he may not empty your breast completely while breastfeeding.  Allowing too much time to pass between feedings.  Wearing a bra or other clothing that is too tight. This puts extra pressure on the lactiferous ducts so milk does not flow through them as it should. Mastitis can also be caused by a bacterial infection. Bacteria may enter the breast tissue through cuts or openings in the skin. In women who are breastfeeding, this may occur because of cracked or irritated skin. Cracks in the skin are often caused when your baby does not latch on properly to the breast. SIGNS AND SYMPTOMS  Swelling, redness, tenderness, and pain in an area of the breast.  Swelling of the glands under the arm on the same side.  Fever may or may not accompany mastitis. If an infection is allowed to progress, a collection of pus (abscess) may develop. DIAGNOSIS  Your health care provider can usually diagnose mastitis based on your symptoms and a physical exam. Tests may be done to help confirm the diagnosis. These may include:  Removal of pus from the breast by applying pressure to the area. This pus can be examined in the lab to determine which bacteria are present. If  an abscess has developed, the fluid in the abscess can be removed with a needle. This can also be used to confirm the diagnosis and determine the bacteria present. In most cases, pus will not be present.  Blood tests to determine if your body is fighting a bacterial infection.  Mammogram or ultrasound tests to rule out other problems or diseases. TREATMENT  Mastitis that occurs with breastfeeding will sometimes go away on its own. Your health care provider may choose to wait 24 hours after first seeing you to decide whether a prescription medicine is needed. If your symptoms are worse after 24 hours, your health care provider will likely prescribe an antibiotic medicine to treat the mastitis. He or she will determine which bacteria are most likely causing the infection and will then select an appropriate antibiotic medicine. This is sometimes changed based on the results of tests performed to identify the bacteria, or if there is no response to the antibiotic medicine selected. Antibiotic medicines are usually given by mouth. You may also be given medicine for pain. HOME CARE INSTRUCTIONS  Only take over-the-counter or prescription medicines for pain, fever, or discomfort as directed by your health care provider.  If your health care provider prescribed an antibiotic medicine, take the medicine as directed. Make sure you finish it even if you start to feel better.  Do not wear a tight or underwire bra. Wear a soft, supportive bra.  Increase your fluid intake, especially if you have a fever.  Continue to empty the breast. Your health  care provider can tell you whether this milk is safe for your infant or needs to be thrown out. You may be told to stop nursing until your health care provider thinks it is safe for your baby. Use a breast pump if you are advised to stop nursing.  Keep your nipples clean and dry.  Empty the first breast completely before going to the other breast. If your baby is not  emptying your breasts completely for some reason, use a breast pump to empty your breasts.  If you go back to work, pump your breasts while at work to stay in time with your nursing schedule.  Avoid allowing your breasts to become overly filled with milk (engorged). SEEK MEDICAL CARE IF:  You have pus-like discharge from the breast.  Your symptoms do not improve with the treatment prescribed by your health care provider within 2 days. SEEK IMMEDIATE MEDICAL CARE IF:  Your pain and swelling are getting worse.  You have pain that is not controlled with medicine.  You have a red line extending from the breast toward your armpit.  You have a fever or persistent symptoms for more than 2-3 days.  You have a fever and your symptoms suddenly get worse. MAKE SURE YOU:   Understand these instructions.  Will watch your condition.  Will get help right away if you are not doing well or get worse. This information is not intended to replace advice given to you by your health care provider. Make sure you discuss any questions you have with your health care provider. Document Released: 01/28/2005 Document Revised: 10/08/2013 Document Reviewed: 05/09/2013 Elsevier Interactive Patient Education  2017 [employer]

## 2016-11-04 NOTE — Progress Notes (Signed)
HPI:                                                                Sierra Morrison is a 36 y.o. female [facility] today for left breast pain  HPI  Patient has been breast feeding for approx. 3 months. She reports approximately 3 nights ago she slept through the night without feeding/pumping. Noticed that her left breast became engorged. Since that time has had tenderness of the outer left breast that she describes as "feels bruised." Today she noticed redness developing in that area. Denies fevers, chills, or purulent nipple drainage. She is able to breastfeed without difficulty. She has been applying warm compresses and massaging the breast, and states this helps.   Health Maintenance Health Maintenance  Topic Date Due  . PAP SMEAR  01/18/2019  . TETANUS/TDAP  05/24/2026  . INFLUENZA VACCINE  Addressed  . HIV Screening  Completed    Past Medical History:  Diagnosis Date  . Anxiety   . Depression   . Endometriosis   . History of abnormal cervical Pap smear   . Infertility, female   . Left ovarian cyst   . Wears glasses    Past Surgical History:  Procedure Laterality Date  . LAPAROSCOPIC OVARIAN CYSTECTOMY Left 03/25/2015   Procedure: LAPAROSCOPIC GELPORT ASSISTED LEFT OVARIAN CYSTECTOMY/EXCISION AND ABLATION OF ENDOMETROSIS/LYSIS OF ADHESIONS/CHROMOTUBATION;  Surgeon: Fermin Schwabamer Yalcinkaya, MD;  [facility] [place];  Service: Gynecology;  Laterality: Left;  . MOLE REMOVAL  2003   spit nevi  . NASAL SEPTUM SURGERY  2007  . WISDOM TOOTH EXTRACTION  age 36   Social History  Substance Use Topics  . Smoking status: Never Smoker  . Smokeless tobacco: Never Used  . Alcohol use 0.6 oz/week    1 Glasses of wine per week     Comment: occassionally   family history includes Cancer in her maternal aunt, maternal grandmother, and mother.  ROS: negative except as noted in the  HPI  Medications: Current Outpatient Prescriptions  Medication Sig Dispense Refill  . acetaminophen (TYLENOL) 325 MG tablet Take 650 mg by mouth every 6 (six) hours as needed for mild pain, moderate pain or headache.    . ibuprofen (ADVIL,MOTRIN) 600 MG tablet Take 1 tablet (600 mg total) by mouth every 6 (six) hours. 30 tablet 0  . Prenatal MV-Min-FA-Omega-3 (PRENATAL GUMMIES/DHA & FA) 0.4-32.5 MG CHEW Chew 2 each by mouth at bedtime.    . sertraline (ZOLOFT) 100 MG tablet     . dicloxacillin (DYNAPEN) 250 MG capsule Take 1 capsule (250 mg total) by mouth 4 (four) times daily. 28 capsule 0   No current facility-administered medications for this visit.    No Known Allergies     Objective:  BP 124/84   Pulse 72   Wt 129 lb (58.5 kg)   BMI 22.85 kg/m  Gen: well-groomed, cooperative, not ill-appearing, no distress Lungs: Normal work of breathing Neuro: alert and oriented x 3, EOM's intact Skin: warm and dry, left breast - approx. 2cm x 3cm round area of erythema without induration on lateral aspect proximal to areola, no streaking, normal appearing nipple and areola, no nipple discharge  Assessment and Plan: 36 y.o. female  with   Acute mastitis of left breast - symptomatic management with regular breast feeding/pumping to prevent engorgement, cold compresses, and Ibuprofen - given prescription for Dicloxacillin 250mg  QID to fill over the weekend if symptoms worsen or fail to improve - instructed to return if no improvement in 3-4 days   Patient education and anticipatory guidance given Patient agrees with treatment plan Follow-up as needed if symptoms worsen or fail to improve  Levonne Hubert PA-C

## 2017-02-05 ENCOUNTER — Other Ambulatory Visit: Payer: Self-pay | Admitting: Physician Assistant

## 2017-02-05 DIAGNOSIS — F411 Generalized anxiety disorder: Secondary | ICD-10-CM

## 2017-02-23 ENCOUNTER — Other Ambulatory Visit: Payer: Self-pay | Admitting: Physician Assistant

## 2017-02-23 DIAGNOSIS — F411 Generalized anxiety disorder: Secondary | ICD-10-CM

## 2017-02-28 ENCOUNTER — Encounter: Payer: Self-pay | Admitting: Physician Assistant

## 2017-02-28 ENCOUNTER — Ambulatory Visit (INDEPENDENT_AMBULATORY_CARE_PROVIDER_SITE_OTHER): Payer: 59 | Admitting: Physician Assistant

## 2017-02-28 VITALS — BP 101/66 | HR 82 | Ht 63.0 in | Wt 122.0 lb

## 2017-02-28 DIAGNOSIS — Z13220 Encounter for screening for lipoid disorders: Secondary | ICD-10-CM | POA: Diagnosis not present

## 2017-02-28 DIAGNOSIS — F419 Anxiety disorder, unspecified: Secondary | ICD-10-CM | POA: Diagnosis not present

## 2017-02-28 DIAGNOSIS — Z131 Encounter for screening for diabetes mellitus: Secondary | ICD-10-CM | POA: Diagnosis not present

## 2017-02-28 DIAGNOSIS — J069 Acute upper respiratory infection, unspecified: Secondary | ICD-10-CM | POA: Diagnosis not present

## 2017-02-28 DIAGNOSIS — F325 Major depressive disorder, single episode, in full remission: Secondary | ICD-10-CM | POA: Diagnosis not present

## 2017-02-28 DIAGNOSIS — Z0000 Encounter for general adult medical examination without abnormal findings: Secondary | ICD-10-CM

## 2017-02-28 MED ORDER — SERTRALINE HCL 100 MG PO TABS: 100.0000 mg | ORAL_TABLET | Freq: Every day | ORAL | 4 refills | Status: DC

## 2017-02-28 NOTE — Patient Instructions (Signed)
Consider mucinex and flonase.  Call if not improving.  Drink loads of water.

## 2017-02-28 NOTE — Progress Notes (Signed)
   Subjective:    Patient ID: Sierra SimmerKristen Nibert, female    DOB: [birth year], 36 y.o.   MRN: [id number]  HPI  Pt is a 36 yo breastfeeding female who presents to the clinic to follow up on depression and anxiety. She is on zoloft. She is doing great. She has a 706 month old at home. She denies any suicidal or homicidal thoughts. She is happy and thriving.   She has had 4 days of nasal congestion, sinus drainage, ST. She is not taking anything because she does not want to dry her milk up. She has an occasional dry cough.    Review of Systems  All other systems reviewed and are negative.      Objective:   Physical Exam  Constitutional: She is oriented to person, place, and time. She appears well-developed and well-nourished.  HENT:  Head: Normocephalic and atraumatic.  Right Ear: External ear normal.  Left Ear: External ear normal.  Nose: Nose normal.  TM's erythematous.  Oropharynx erythematous with some PND.  Negative for sinus tenderness to palpation.   Eyes: Conjunctivae are normal. Right eye exhibits no discharge. Left eye exhibits no discharge.  Neck: Normal range of motion. Neck supple.  Cardiovascular: Normal rate, regular rhythm and normal heart sounds.   Pulmonary/Chest: Effort normal and breath sounds normal. She has no wheezes.  Lymphadenopathy:    She has no cervical adenopathy.  Neurological: She is alert and oriented to person, place, and time.  Psychiatric: She has a normal mood and affect. Her behavior is normal.          Assessment & Plan:   Marland Kitchen.Marland Kitchen.[name] was seen today for depression and anxiety.  Diagnoses and all orders for this visit:  Major depressive disorder with single episode, in full remission (HCC) -     sertraline (ZOLOFT) 100 MG tablet; Take 1 tablet (100 mg total) by mouth daily.  Screening for lipid disorders -     Lipid panel  Screening for diabetes mellitus -     COMPLETE METABOLIC PANEL WITH GFR  Preventative health care -     Lipid  panel -     COMPLETE METABOLIC PANEL WITH GFR  Anxiety -     sertraline (ZOLOFT) 100 MG tablet; Take 1 tablet (100 mg total) by mouth daily.  Acute upper respiratory infection      .Marland Kitchen. [facility] screen [facility] 02/28/2017  Decreased Interest 0  Down, Depressed, Hopeless 0  PHQ - 2 Score 0  Altered sleeping 0  Tired, decreased energy 0  Change in appetite 0  Feeling bad or failure about yourself  0  Trouble concentrating 0  Moving slowly or fidgety/restless 0  Suicidal thoughts 0  PHQ-9 Score 0   .. GAD 7 : Generalized Anxiety Score 02/28/2017  Nervous, Anxious, on Edge 0  Control/stop worrying 0  Worry too much - different things 0  Trouble relaxing 0  Restless 0  Easily annoyed or irritable 0  Afraid - awful might happen 0  Total GAD 7 Score 0   zoloft refilled for 1 year. cmp ordered for medication management.   Symptomatic care discussed. Likely viral URI. Consider OTC flonase and mucinex. Drink loads of water. Warned against anti-histamines and decongestants due to milk supply decrease. If not improving then will consider abx after 7-14 days of symptomatic care.

## 2017-08-03 ENCOUNTER — Encounter: Payer: Self-pay | Admitting: Physician Assistant

## 2017-08-03 ENCOUNTER — Ambulatory Visit (INDEPENDENT_AMBULATORY_CARE_PROVIDER_SITE_OTHER): Payer: 59 | Admitting: Physician Assistant

## 2017-08-03 VITALS — BP 120/80 | HR 59 | Temp 98.2°F | Wt 121.0 lb

## 2017-08-03 DIAGNOSIS — J040 Acute laryngitis: Secondary | ICD-10-CM | POA: Diagnosis not present

## 2017-08-03 NOTE — Patient Instructions (Addendum)
-   Mucinex     Laryngitis Laryngitis is inflammation of your vocal cords. This causes hoarseness, coughing, loss of voice, sore throat, or a dry throat. Your vocal cords are two bands of muscles that are found in your throat. When you speak, these cords come together and vibrate. These vibrations come out through your mouth as sound. When your vocal cords are inflamed, your voice sounds different. Laryngitis can be temporary (acute) or long-term (chronic). Most cases of acute laryngitis improve with time. Chronic laryngitis is laryngitis that lasts for more than three weeks. What are the causes? Acute laryngitis may be caused by:  A viral infection.  Lots of talking, yelling, or singing. This is also called vocal strain.  Bacterial infections.  Chronic laryngitis may be caused by:  Vocal strain.  Injury to your vocal cords.  Acid reflux (gastroesophageal reflux disease or GERD).  Allergies.  Sinus infection.  Smoking.  Alcohol abuse.  Breathing in chemicals or dust.  Growths on the vocal cords.  What increases the risk? Risk factors for laryngitis include:  Smoking.  Alcohol abuse.  Having allergies.  What are the signs or symptoms? Symptoms of laryngitis may include:  Low, hoarse voice.  Loss of voice.  Dry cough.  Sore throat.  Stuffy nose.  How is this diagnosed? Laryngitis may be diagnosed by:  Physical exam.  Throat culture.  Blood test.  Laryngoscopy. This procedure allows your health care provider to look at your vocal cords with a mirror or viewing tube.  How is this treated? Treatment for laryngitis depends on what is causing it. Usually, treatment involves resting your voice and using medicines to soothe your throat. However, if your laryngitis is caused by a bacterial infection, you may need to take antibiotic medicine. If your laryngitis is caused by a growth, you may need to have a procedure to remove it. Follow these instructions at  home:  Drink enough fluid to keep your urine clear or pale yellow.  Breathe in moist air. Use a humidifier if you live in a dry climate.  Take medicines only as directed by your health care provider.  If you were prescribed an antibiotic medicine, finish it all even if you start to feel better.  Do not smoke cigarettes or electronic cigarettes. If you need help quitting, ask your health care provider.  Talk as little as possible. Also avoid whispering, which can cause vocal strain.  Write instead of talking. Do this until your voice is back to normal. Contact a health care provider if:  You have a fever.  You have increasing pain.  You have difficulty swallowing. Get help right away if:  You cough up blood.  You have trouble breathing. This information is not intended to replace advice given to you by your health care provider. Make sure you discuss any questions you have with your health care provider. Document Released: 10/03/2005 Document Revised: 03/10/2016 Document Reviewed: 03/18/2014 Elsevier Interactive Patient Education  [employer]2018 Elsevier Inc.

## 2017-08-03 NOTE — Progress Notes (Addendum)
HPI:                                                                Sierra SimmerKristen Anstead is a 36 y.o. female who [facility] today for URI symptoms  URI   This is a new problem. The current episode started in the past 7 days (x 3 days). The problem has been gradually worsening. There has been no fever. Associated symptoms include congestion, coughing (productive of purulent mucus) and headaches. Pertinent negatives include no abdominal pain, ear pain, neck pain, sore throat or vomiting. Associated symptoms comments: + hoarseness. She has tried decongestant (Alka-Seltzer) for the symptoms. The treatment provided moderate relief.     Past Medical History:  Diagnosis Date  . Anxiety   . Depression   . Endometriosis   . History of abnormal cervical Pap smear   . Infertility, female   . Left ovarian cyst   . Wears glasses    Past Surgical History:  Procedure Laterality Date  . LAPAROSCOPIC OVARIAN CYSTECTOMY Left 03/25/2015   Procedure: LAPAROSCOPIC GELPORT ASSISTED LEFT OVARIAN CYSTECTOMY/EXCISION AND ABLATION OF ENDOMETROSIS/LYSIS OF ADHESIONS/CHROMOTUBATION;  Surgeon: Fermin Schwabamer Yalcinkaya, MD;  [facility] [place];  Service: Gynecology;  Laterality: Left;  . MOLE REMOVAL  2003   spit nevi  . NASAL SEPTUM SURGERY  2007  . WISDOM TOOTH EXTRACTION  age 36   Social History  Substance Use Topics  . Smoking status: Never Smoker  . Smokeless tobacco: Never Used  . Alcohol use 0.6 oz/week    1 Glasses of wine per week     Comment: occassionally   family history includes Cancer in her maternal aunt, maternal grandmother, and mother.  ROS: negative except as noted in the HPI  Medications: Current Outpatient Prescriptions  Medication Sig Dispense Refill  . Prenatal MV-Min-FA-Omega-3 (PRENATAL GUMMIES/DHA & FA) 0.4-32.5 MG CHEW Chew 2 each by mouth at bedtime.    . sertraline (ZOLOFT) 100 MG tablet Take 1 tablet  (100 mg total) by mouth daily. 90 tablet 4   No current facility-administered medications for this visit.    No Known Allergies     Objective:  BP 120/80   Pulse (!) 59   Temp 98.2 F (36.8 C) (Oral)   Wt 121 lb (54.9 kg)   BMI 21.43 kg/m  Gen:  alert, not ill-appearing, no distress, appropriate for age HEENT: head normocephalic without obvious abnormality, conjunctiva and cornea clear, TM's clear bilaterally, oropharynx without erythema, no exudates, uvula midline, neck supple, no cervical or tonsillar adenopathy, trachea midline Pulm: Normal work of breathing, whispering, clear to auscultation bilaterally, no wheezes, rales or rhonchi CV: Normal rate, regular rhythm, s1 and s2 distinct, no murmurs, clicks or rubs  Neuro: alert and oriented x 3, no tremor MSK: extremities atraumatic, normal gait and station Skin: intact, no rashes on exposed skin, no jaundice, no cyanosis   No results found for this or any previous visit (from the past 72 hour(s)). No results found.    Assessment and Plan: 36 y.o. female with   1. Acute laryngitis - symptomatic management: oral decongestant, Mucinex for cough - vocal rest  Patient education and anticipatory guidance given Patient agrees with treatment plan Follow-up as needed if  symptoms worsen or fail to improve  Darlyne Russian PA-C

## 2018-03-23 ENCOUNTER — Other Ambulatory Visit: Payer: Self-pay | Admitting: Physician Assistant

## 2018-03-23 DIAGNOSIS — F419 Anxiety disorder, unspecified: Secondary | ICD-10-CM

## 2018-03-23 DIAGNOSIS — F325 Major depressive disorder, single episode, in full remission: Secondary | ICD-10-CM

## 2018-10-24 ENCOUNTER — Ambulatory Visit: Payer: 59 | Admitting: Certified Nurse Midwife

## 2018-11-16 ENCOUNTER — Ambulatory Visit (INDEPENDENT_AMBULATORY_CARE_PROVIDER_SITE_OTHER): Payer: 59

## 2018-11-16 VITALS — BP 106/70 | HR 91 | Resp 16 | Ht 63.0 in | Wt 117.0 lb

## 2018-11-16 DIAGNOSIS — Z30011 Encounter for initial prescription of contraceptive pills: Secondary | ICD-10-CM | POA: Diagnosis not present

## 2018-11-16 MED ORDER — NORGESTIMATE-ETH ESTRADIOL 0.25-35 MG-MCG PO TABS: 1.0000 | ORAL_TABLET | Freq: Every day | ORAL | 11 refills | Status: DC

## 2018-11-16 NOTE — Progress Notes (Signed)
History:  Ms. Sierra Morrison is a 38 y.o. [phone] who presents to clinic today for birth control counseling.  States she would like to get back on birth control pills to control her periods.  Hx of endometriosis and infertility.  Denies hx of menstrual irregularity, heavy periods, HTN, DVT, CVA, or breast cancer.  No family hx of DVT, HTN, or CVA.  Reports family hx of breast cancer.  Was tested for BRCA gene and negative.  Took birth control  pills previously without any side effects.  Unsure of what kind.  Last pap smear on 01/18/16 negative with negative HPV.  The following portions of the patient's history were reviewed and updated as appropriate: allergies, current medications, family history, past medical history, social history, past surgical history and problem list.  Review of Systems:  Review of Systems  Respiratory: Negative for sputum production.   Cardiovascular: Negative for chest pain and leg swelling.       Denies hx of DVT, HTN, CVA, or family hx of CVA, DVT, or HTN.  Neurological: Negative for headaches.       Denies any hx of migraines.      Objective:  Physical Exam BP 106/70   Pulse 91   Resp 16   Ht 5' 3"  (1.6 m)   Wt 117 lb (53.1 kg)   LMP 10/27/2018   Breastfeeding No   BMI 20.73 kg/m  Physical Exam Constitutional:      Appearance: Normal appearance. She is normal weight.  HENT:     Head: Normocephalic and atraumatic.  Eyes:     Conjunctiva/sclera: Conjunctivae normal.     Pupils: Pupils are equal, round, and reactive to light.  Cardiovascular:     Rate and Rhythm: Normal rate and regular rhythm.  Pulmonary:     Effort: Pulmonary effort is normal.     Breath sounds: Normal breath sounds.  Abdominal:     General: Abdomen is flat. Bowel sounds are normal.     Palpations: Abdomen is soft.  Skin:    General: Skin is warm and dry.     Capillary Refill: Capillary refill takes less than 2 seconds.  Neurological:     General: No focal deficit present.      Mental Status: She is alert and oriented to person, place, and time.  Psychiatric:        Mood and Affect: Mood normal.        Behavior: Behavior normal.        Thought Content: Thought content normal.        Judgment: Judgment normal.      Labs and Imaging No results found for this or any previous visit (from the past 24 hour(s)).  No results found.   Assessment & Plan:  1. Encounter for oral contraception initial prescription Discussed options for birth control, including IUD, Nexplanon, and birth control pills.  Desires to go back on pills.  Prescription for Ortho Cycl[facility] sent to pharmacy.  Risks, benefits, and adverse events discussed with patient.  Counseled on when to start the pill.  F/U prn.  Elson Clan Rosana Hoes, SNP 11/16/2018 9:27 AM

## 2019-02-06 ENCOUNTER — Other Ambulatory Visit: Payer: Self-pay | Admitting: Physician Assistant

## 2019-02-06 DIAGNOSIS — F325 Major depressive disorder, single episode, in full remission: Secondary | ICD-10-CM

## 2019-02-06 DIAGNOSIS — F419 Anxiety disorder, unspecified: Secondary | ICD-10-CM

## 2019-02-06 NOTE — Telephone Encounter (Signed)
Left pt msg to call and schedule appt- not seen since May 2018

## 2019-02-08 ENCOUNTER — Ambulatory Visit (INDEPENDENT_AMBULATORY_CARE_PROVIDER_SITE_OTHER): Payer: 59 | Admitting: Physician Assistant

## 2019-02-08 ENCOUNTER — Encounter: Payer: Self-pay | Admitting: Physician Assistant

## 2019-02-08 VITALS — Ht 63.0 in | Wt 116.0 lb

## 2019-02-08 DIAGNOSIS — F410 Panic disorder [episodic paroxysmal anxiety] without agoraphobia: Secondary | ICD-10-CM | POA: Diagnosis not present

## 2019-02-08 DIAGNOSIS — F419 Anxiety disorder, unspecified: Secondary | ICD-10-CM

## 2019-02-08 DIAGNOSIS — F325 Major depressive disorder, single episode, in full remission: Secondary | ICD-10-CM | POA: Diagnosis not present

## 2019-02-08 DIAGNOSIS — G479 Sleep disorder, unspecified: Secondary | ICD-10-CM | POA: Insufficient documentation

## 2019-02-08 MED ORDER — VORTIOXETINE HBR 10 MG PO TABS: 10.0000 mg | ORAL_TABLET | Freq: Every day | ORAL | 1 refills | Status: DC

## 2019-02-08 MED ORDER — ALPRAZOLAM 0.5 MG PO TABS: 0.5000 mg | ORAL_TABLET | Freq: Two times a day (BID) | ORAL | 1 refills | Status: DC | PRN

## 2019-02-08 NOTE — Progress Notes (Signed)
Needs refill of zoloft. PHQ9-GAD7 completed.

## 2019-02-08 NOTE — Progress Notes (Addendum)
Patient ID: Sierra Morrison, female   DOB: [birth year], 38 y.o.   MRN: [id number] .Virtual Visit via Video Note  I connected with Sierra Morrison on 02/08/19 at  1:40 PM EDT by a video enabled telemedicine application and verified that I am speaking with the correct person using two identifiers.   I discussed the limitations of evaluation and management by telemedicine and the availability of in person appointments. The patient expressed understanding and agreed to proceed.  History of Present Illness: Pt is a 38 yo female with anxiety and depression who calls into the clinic for a follow up and medication refill.   Pt has had ongoing anxiety and depression but feels like it has been worsening since December. She has also started to have panic attacks. She restarted counseling which is helping some but concerned her zoloft is just not working anymore. She remembers going up to 200mg  in the past and then able to come back down but interested in trying something else. She tried paxil also but did not seem to help as much. Her panic attacks come out of nowhere and cause her to feel SOB, sweaty, anxious. She has been having 4-10 a month. She is also having trouble sleeping. Melatonin helps some. No SI/HC.  Marland Kitchen. Active Ambulatory Problems    Diagnosis Date Noted  . 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 Anxiety 10/25/2011  . Depression 10/25/2011  . Family history of breast cancer in first degree relative 10/25/2011  . Endometriosis 01/18/2016  . 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 Panic attacks 02/08/2019  . Trouble in sleeping 02/08/2019   Resolved Ambulatory Problems    Diagnosis Date Noted  . Pregnancy 01/04/2016  . Supervision of low-risk first pregnancy 01/18/2016  . GBS bacteriuria 01/22/2016  . [redacted] weeks gestation of pregnancy   . Abnormal findings on antenatal screening   . Indication for care or intervention related to labor and delivery 08/05/2016   Past Medical History:   Diagnosis Date  . History of abnormal cervical Pap smear   . Infertility, female   . Left ovarian cyst   . Wears glasses    Reviewed med, allergy, problem list.    Observations/Objective: No acute distress. Normal appearance and dress.  Normal mood.  No labored breathing.   .. Today's Vitals   02/08/19 1330  Weight: 116 lb (52.6 kg)  Height: 5\' 3"  (1.6 m)   Body mass index is 20.55 kg/m.  .. Depression screen Sierra Morrison 2/9 02/08/2019 02/28/2017  Decreased Interest 0 0  Down, Depressed, Hopeless 0 0  PHQ - 2 Score 0 0  Altered sleeping 3 0  Tired, decreased energy 0 0  Change in appetite 0 0  Feeling bad or failure about yourself  0 0  Trouble concentrating 1 0  Moving slowly or fidgety/restless 1 0  Suicidal thoughts 0 0  PHQ-9 Score 5 0  Difficult doing work/chores Somewhat difficult -   .. GAD 7 : Generalized Anxiety Score 02/08/2019 02/28/2017  Nervous, Anxious, on Edge 3 0  Control/stop worrying 1 0  Worry too much - different things 1 0  Trouble relaxing 2 0  Restless 1 0  Easily annoyed or irritable 3 0  Afraid - awful might happen 1 0  Total GAD 7 Score 12 0  Anxiety Difficulty Somewhat difficult -     Assessment and Plan: Marland KitchenMarland KitchenQuanetta was seen today for depression.  Diagnoses and all orders for this visit:  Anxiety -     vortioxetine HBr (TRINTELLIX)  10 MG TABS tablet; Take 1 tablet (10 mg total) by mouth daily.  Major depressive disorder with single episode, in full remission (HCC) -     vortioxetine HBr (TRINTELLIX) 10 MG TABS tablet; Take 1 tablet (10 mg total) by mouth daily.  Panic attacks -     ALPRAZolam (XANAX) 0.5 MG tablet; Take 1 tablet (0.5 mg total) by mouth 2 (two) times daily as needed for anxiety.  Trouble in sleeping   Coupon card for Trintellix left up front for patient pick up.  Start 1/2 tablet of zoloft and 1/2 tablet of trintellix for 7 days then stop zoloft and start full tablet of trintellix.  Follow up in 4 weeks.  Continue  with therapy.  Xanax given for as needed panic attacks. Discussed dependency risk.  Discussed good sleep routine.     Follow Up Instructions:    I discussed the assessment and treatment plan with the patient. The patient was provided an opportunity to ask questions and all were answered. The patient agreed with the plan and demonstrated an understanding of the instructions.   The patient was advised to call back or seek an in-person evaluation if the symptoms worsen or if the condition fails to improve as anticipated.  I provided 25 minutes of non-face-to-face time during this encounter.   Tandy GawJade Breeback, PA-C

## 2019-04-07 ENCOUNTER — Other Ambulatory Visit: Payer: Self-pay | Admitting: Physician Assistant

## 2019-04-07 DIAGNOSIS — F325 Major depressive disorder, single episode, in full remission: Secondary | ICD-10-CM

## 2019-04-07 DIAGNOSIS — F419 Anxiety disorder, unspecified: Secondary | ICD-10-CM

## 2019-04-08 ENCOUNTER — Telehealth (INDEPENDENT_AMBULATORY_CARE_PROVIDER_SITE_OTHER): Payer: 59 | Admitting: Physician Assistant

## 2019-04-08 ENCOUNTER — Encounter: Payer: Self-pay | Admitting: Physician Assistant

## 2019-04-08 VITALS — Ht 63.0 in | Wt 119.0 lb

## 2019-04-08 DIAGNOSIS — Z13220 Encounter for screening for lipoid disorders: Secondary | ICD-10-CM | POA: Diagnosis not present

## 2019-04-08 DIAGNOSIS — Z131 Encounter for screening for diabetes mellitus: Secondary | ICD-10-CM

## 2019-04-08 DIAGNOSIS — F419 Anxiety disorder, unspecified: Secondary | ICD-10-CM

## 2019-04-08 DIAGNOSIS — F325 Major depressive disorder, single episode, in full remission: Secondary | ICD-10-CM

## 2019-04-08 MED ORDER — VORTIOXETINE HBR 10 MG PO TABS: 10.0000 mg | ORAL_TABLET | Freq: Every day | ORAL | 1 refills | Status: DC

## 2019-04-08 NOTE — Progress Notes (Signed)
Patient ID: Sierra Morrison, female   DOB: 1981-04-10, 38 y.o.   MRN: [id number] .Marland KitchenVirtual Visit via Video Note  I connected with Sierra Morrison on 04/08/19 at 10:50 AM EDT by a video enabled telemedicine application and verified that I am speaking with the correct person using two identifiers.  Location: Patient: home Provider: clinic   I discussed the limitations of evaluation and management by telemedicine and the availability of in person appointments. The patient expressed understanding and agreed to proceed.  History of Present Illness: Pt is a 38 yo female with MDD and anxiety who calls in to follow up on medication change from zoloft to trintellix. She is doing great on transition. She did have some nausea but seems to have resolved. She feels like she can think clearer. She is very happy. No SI/HC.   Marland Kitchen. Active Ambulatory Problems    Diagnosis Date Noted  . 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 Anxiety 10/25/2011  . Depression 10/25/2011  . Family history of breast cancer in first degree relative 10/25/2011  . Endometriosis 01/18/2016  . Velamentous insertion of umbilical cord 78/29/5621  . Panic attacks 02/08/2019  . Trouble in sleeping 02/08/2019   Resolved Ambulatory Problems    Diagnosis Date Noted  . Pregnancy 01/04/2016  . Supervision of low-risk first pregnancy 01/18/2016  . GBS bacteriuria 01/22/2016  . [redacted] weeks gestation of pregnancy   . Abnormal findings on antenatal screening   . Indication for care or intervention related to labor and delivery 08/05/2016   Past Medical History:  Diagnosis Date  . History of abnormal cervical Pap smear   . Infertility, female   . Left ovarian cyst   . Wears glasses    Reviewed med, allergy, problem list.     Observations/Objective: No acute distress. Normal mood.  Normal appearance.   .. Today's Vitals   04/08/19 1021  Weight: 119 lb (54 kg)  Height: 5\' 3"  (1.6 m)   Body mass index is 21.08 kg/m.  .. GAD 7 :  Generalized Anxiety Score 04/08/2019 02/08/2019 02/28/2017  Nervous, Anxious, on Edge 1 3 0  Control/stop worrying 1 1 0  Worry too much - different things 0 1 0  Trouble relaxing 0 2 0  Restless 0 1 0  Easily annoyed or irritable 1 3 0  Afraid - awful might happen 0 1 0  Total GAD 7 Score 3 12 0  Anxiety Difficulty Not difficult at all Somewhat difficult -    .Marland Kitchen Depression screen [facility] 2/9 04/08/2019 02/08/2019 02/28/2017  Decreased Interest 0 0 0  Down, Depressed, Hopeless 0 0 0  PHQ - 2 Score 0 0 0  Altered sleeping 0 3 0  Tired, decreased energy 0 0 0  Change in appetite 0 0 0  Feeling bad or failure about yourself  0 0 0  Trouble concentrating 0 1 0  Moving slowly or fidgety/restless 0 1 0  Suicidal thoughts 0 0 0  PHQ-9 Score 0 5 0  Difficult doing work/chores Not difficult at all Somewhat difficult -    Assessment and Plan: Marland KitchenMarland KitchenDeryn was seen today for anxiety.  Diagnoses and all orders for this visit:  Major depressive disorder with single episode, in full remission ([place]) -     vortioxetine HBr (TRINTELLIX) 10 MG TABS tablet; Take 1 tablet (10 mg total) by mouth daily.  Anxiety -     vortioxetine HBr (TRINTELLIX) 10 MG TABS tablet; Take 1 tablet (10 mg total) by mouth daily.  Screening for diabetes  mellitus -     COMPLETE METABOLIC PANEL WITH GFR  Screening for lipid disorders -     Lipid Panel w/reflex Direct LDL   Refilled Trintellix for 6 months. Pt is doing great.  Fasting labs ordered.   Follow Up Instructions:    I discussed the assessment and treatment plan with the patient. The patient was provided an opportunity to ask questions and all were answered. The patient agreed with the plan and demonstrated an understanding of the instructions.   The patient was advised to call back or seek an in-person evaluation if the symptoms worsen or if the condition fails to improve as anticipated.   Tandy GawJade Laine Giovanetti, PA-C

## 2019-07-29 ENCOUNTER — Other Ambulatory Visit: Payer: Self-pay

## 2019-07-29 DIAGNOSIS — Z30011 Encounter for initial prescription of contraceptive pills: Secondary | ICD-10-CM

## 2019-08-06 ENCOUNTER — Encounter: Payer: Self-pay | Admitting: Advanced Practice Midwife

## 2019-08-06 ENCOUNTER — Other Ambulatory Visit: Payer: Self-pay

## 2019-08-06 ENCOUNTER — Ambulatory Visit: Payer: 59 | Admitting: Advanced Practice Midwife

## 2019-08-06 VITALS — BP 110/77 | HR 114 | Temp 98.4°F | Ht 63.0 in | Wt 119.0 lb

## 2019-08-06 DIAGNOSIS — Z113 Encounter for screening for infections with a predominantly sexual mode of transmission: Secondary | ICD-10-CM

## 2019-08-06 DIAGNOSIS — B373 Candidiasis of vulva and vagina: Secondary | ICD-10-CM

## 2019-08-06 DIAGNOSIS — N898 Other specified noninflammatory disorders of vagina: Secondary | ICD-10-CM

## 2019-08-06 DIAGNOSIS — A6004 Herpesviral vulvovaginitis: Secondary | ICD-10-CM

## 2019-08-06 DIAGNOSIS — N949 Unspecified condition associated with female genital organs and menstrual cycle: Secondary | ICD-10-CM

## 2019-08-06 DIAGNOSIS — B3731 Acute candidiasis of vulva and vagina: Secondary | ICD-10-CM

## 2019-08-06 MED ORDER — VALACYCLOVIR HCL 1 G PO TABS: 1000.0000 mg | ORAL_TABLET | Freq: Two times a day (BID) | ORAL | 0 refills | Status: AC

## 2019-08-06 MED ORDER — LIDOCAINE 5 % EX OINT: 1.0000 "application " | TOPICAL_OINTMENT | CUTANEOUS | 2 refills | Status: DC | PRN

## 2019-08-06 MED ORDER — FLUCONAZOLE 150 MG PO TABS: 150.0000 mg | ORAL_TABLET | Freq: Once | ORAL | 0 refills | Status: AC

## 2019-08-06 NOTE — Progress Notes (Signed)
History:  Sierra Morrison is a 38 y.o. [phone] who presents to clinic today for vaginal discharge, irritation, and itching that started about 1.5 weeks ago. Took a 1-day Monistat on Friday and had some improvement of symptoms on Saturday. Reports that on Sunday her symptoms returned and she noticed some red, painful bumps. She denies fever, vaginal bleeding, or abdominal/pelvic pain.   The following portions of the patient's history were reviewed and updated as appropriate: allergies, current medications, family history, past medical history, social history, past surgical history and problem list.  Review of Systems:  Review of Systems  Constitutional: Negative.   HENT: Negative.   Eyes: Negative.   Respiratory: Negative.   Cardiovascular: Negative.   Gastrointestinal: Negative.   Musculoskeletal: Negative.   Skin: Negative.   Neurological: Negative.   Endo/Heme/Allergies: Negative.   Psychiatric/Behavioral: Negative.       Objective:  Physical Exam BP 110/77   Pulse (!) 114   Temp 98.4 F (36.9 C)   Ht 5\' 3"  (1.6 m)   Wt 54 kg   BMI 21.08 kg/m  Physical Exam Vitals signs and nursing note reviewed. Exam conducted with a chaperone present.  Constitutional:      Appearance: Normal appearance.  HENT:     Head: Normocephalic and atraumatic.     Mouth/Throat:     Mouth: Mucous membranes are dry.  Eyes:     Pupils: Pupils are equal, round, and reactive to light.  Neck:     Musculoskeletal: Normal range of motion and neck supple.  Cardiovascular:     Rate and Rhythm: Normal rate and regular rhythm.  Pulmonary:     Effort: Pulmonary effort is normal.  Abdominal:     General: Abdomen is flat.     Palpations: Abdomen is soft.  Genitourinary:    Vagina: Vaginal discharge present.     Comments: Watery discharge present. Yeast noted at vaginal introitus. Vulvar erythema present. Multiple small, irregular shaped, painful red lesions with white patches on bilateral  labia Skin:    General: Skin is warm and dry.  Neurological:     General: No focal deficit present.     Mental Status: She is alert and oriented to person, place, and time.  Psychiatric:        Mood and Affect: Mood normal.        Behavior: Behavior normal.        Thought Content: Thought content normal.        Judgment: Judgment normal.      Labs and Imaging No results found for this or any previous visit (from the past 24 hour(s)).  No results found.   Assessment & Plan:  1. Female genital lesion - Multiple red, patchy, painful lesions. - Cannot r/o HSV.  - HSV culture obtained - Will start patient on Valtrex today  - valACYclovir (VALTREX) 1000 MG tablet; Take 1 tablet (1,000 mg total) by mouth 2 (two) times daily for 10 days.  Dispense: 20 tablet; Refill: 0 - lidocaine (XYLOCAINE) 5 % ointment; Apply 1 application topically as needed.  Dispense: 35.44 g; Refill: 2  2. Vaginal yeast infection - Yeast noted at vaginal introitus - Swab collected today - Will prescribe Diflucan   - fluconazole (DIFLUCAN) 150 MG tablet; Take 1 tablet (150 mg total) by mouth once for 1 dose. Take one tablet now and one in 3 days  Dispense: 2 tablet; Refill: 0  Follow up as needed if symptoms continue or worsen.  ,  SNM 08/06/2019 10:05 AM

## 2019-08-06 NOTE — Progress Notes (Signed)
Pt c/o vaginal discharge and irritation starting on Friday. Pt now notices "sores" since sunday. Pt denies UTI symptoms

## 2019-08-07 LAB — TIQ- AMBIGUOUS ORDER

## 2019-08-08 LAB — HERPES SIMPLEX VIRUS CULTURE
MICRO NUMBER:: 1010561
SPECIMEN QUALITY:: ADEQUATE

## 2019-08-10 MED ORDER — VALACYCLOVIR HCL 1 G PO TABS: 1000.0000 mg | ORAL_TABLET | Freq: Every day | ORAL | 5 refills | Status: DC

## 2019-08-10 NOTE — Addendum Note (Signed)
Addended by: Fatima Blank A on: 08/10/2019 07:21 PM   Modules accepted: Orders

## 2019-08-16 LAB — CERVICOVAGINAL ANCILLARY ONLY
Bacterial Vaginitis (gardnerella): NEGATIVE
Candida Glabrata: NEGATIVE
Candida Vaginitis: POSITIVE — AB
Chlamydia: NEGATIVE
Comment: NEGATIVE
Comment: NEGATIVE
Comment: NEGATIVE
Comment: NEGATIVE
Comment: NEGATIVE
Comment: NORMAL
Neisseria Gonorrhea: NEGATIVE
Trichomonas: NEGATIVE

## 2019-09-03 ENCOUNTER — Other Ambulatory Visit: Payer: Self-pay | Admitting: Advanced Practice Midwife

## 2019-09-03 DIAGNOSIS — A6004 Herpesviral vulvovaginitis: Secondary | ICD-10-CM

## 2019-09-03 MED ORDER — VALACYCLOVIR HCL 1 G PO TABS: 1000.0000 mg | ORAL_TABLET | Freq: Every day | ORAL | 11 refills | Status: DC

## 2019-11-17 ENCOUNTER — Other Ambulatory Visit: Payer: Self-pay | Admitting: Physician Assistant

## 2019-11-17 DIAGNOSIS — F325 Major depressive disorder, single episode, in full remission: Secondary | ICD-10-CM

## 2019-11-17 DIAGNOSIS — F419 Anxiety disorder, unspecified: Secondary | ICD-10-CM

## 2020-01-22 ENCOUNTER — Ambulatory Visit (INDEPENDENT_AMBULATORY_CARE_PROVIDER_SITE_OTHER): Payer: 59 | Admitting: Physician Assistant

## 2020-01-22 ENCOUNTER — Other Ambulatory Visit: Payer: Self-pay

## 2020-01-22 DIAGNOSIS — F410 Panic disorder [episodic paroxysmal anxiety] without agoraphobia: Secondary | ICD-10-CM

## 2020-01-22 DIAGNOSIS — F419 Anxiety disorder, unspecified: Secondary | ICD-10-CM

## 2020-01-22 DIAGNOSIS — F325 Major depressive disorder, single episode, in full remission: Secondary | ICD-10-CM | POA: Diagnosis not present

## 2020-01-22 MED ORDER — VORTIOXETINE HBR 10 MG PO TABS: 10.0000 mg | ORAL_TABLET | Freq: Every day | ORAL | 3 refills | Status: DC

## 2020-01-22 MED ORDER — ALPRAZOLAM 0.5 MG PO TABS: 0.5000 mg | ORAL_TABLET | Freq: Two times a day (BID) | ORAL | 0 refills | Status: DC | PRN

## 2020-01-22 MED ORDER — BUSPIRONE HCL 5 MG PO TABS: 5.0000 mg | ORAL_TABLET | Freq: Two times a day (BID) | ORAL | 2 refills | Status: DC

## 2020-01-22 NOTE — Progress Notes (Signed)
Subjective:    Patient ID: Sierra Morrison, female    DOB: 09/09/1981, 39 y.o.   MRN: [id number]  HPI  Patient is a 39 year old female with MDD, GAD, panic attacks who presents to the clinic for medication follow-up.  She feels like her depression is much better on Trintellix.  She feels like her anxiety is not doing very well.  She admits she still having the situational life factors with her marriage.  She feels like that is not necessarily helping.  She is going to counseling.  She is exercising every day.  She does admit to having panic attacks at least 3 times a week.  She is not taking Xanax every time.  She has only used 30 tablets over the last year.  She does need refills on everything.  She denies any suicidal or homicidal thoughts.  .. Active Ambulatory Problems    Diagnosis Date Noted  . 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 Anxiety 10/25/2011  . Depression 10/25/2011  . Family history of breast cancer in first degree relative 10/25/2011  . Endometriosis 01/18/2016  . 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 Panic attacks 02/08/2019  . Trouble in sleeping 02/08/2019   Resolved Ambulatory Problems    Diagnosis Date Noted  . Pregnancy 01/04/2016  . Supervision of low-risk first pregnancy 01/18/2016  . GBS bacteriuria 01/22/2016  . [redacted] weeks gestation of pregnancy   . Abnormal findings on antenatal screening   . Indication for care or intervention related to labor and delivery 08/05/2016   Past Medical History:  Diagnosis Date  . History of abnormal cervical Pap smear   . Infertility, female   . Left ovarian cyst   . Wears glasses       Review of Systems See HPI.     Objective:   Physical Exam Vitals reviewed.  Constitutional:      Appearance: Normal appearance.  HENT:     Head: Normocephalic.  Cardiovascular:     Rate and Rhythm: Normal rate and regular rhythm.     Pulses: Normal pulses.  Pulmonary:     Effort: Pulmonary effort is normal.     Breath  sounds: Normal breath sounds.  Neurological:     General: No focal deficit present.     Mental Status: She is alert and oriented to person, place, and time.  Psychiatric:        Mood and Affect: Mood normal.        Behavior: Behavior normal.       .. Depression screen [facility] 2/9 01/22/2020 04/08/2019 02/08/2019 02/28/2017  Decreased Interest 1 0 0 0  Down, Depressed, Hopeless 2 0 0 0  PHQ - 2 Score 3 0 0 0  Altered sleeping 3 0 3 0  Tired, decreased energy 1 0 0 0  Change in appetite 0 0 0 0  Feeling bad or failure about yourself  1 0 0 0  Trouble concentrating 0 0 1 0  Moving slowly or fidgety/restless 0 0 1 0  Suicidal thoughts 0 0 0 0  PHQ-9 Score 8 0 5 0  Difficult doing work/chores Somewhat difficult Not difficult at all Somewhat difficult -   .. GAD 7 : Generalized Anxiety Score 01/22/2020 04/08/2019 02/08/2019 02/28/2017  Nervous, Anxious, on Edge 3 1 3  0  Control/stop worrying 3 1 1  0  Worry too much - different things 3 0 1 0  Trouble relaxing 1 0 2 0  Restless 1 0 1 0  Easily annoyed or  irritable 1 1 3  0  Afraid - awful might happen 0 0 1 0  Total GAD 7 Score 12 3 12  0  Anxiety Difficulty - Not difficult at all Somewhat difficult -        Assessment & Plan:  Marland KitchenMarland KitchenJasmeet was seen today for anxiety.  Diagnoses and all orders for this visit:  Anxiety -     busPIRone (BUSPAR) 5 MG tablet; Take 1 tablet (5 mg total) by mouth 2 (two) times daily. -     vortioxetine HBr (TRINTELLIX) 10 MG TABS tablet; Take 1 tablet (10 mg total) by mouth daily.  Major depressive disorder with single episode, in full remission ([place]) -     vortioxetine HBr (TRINTELLIX) 10 MG TABS tablet; Take 1 tablet (10 mg total) by mouth daily.  Panic attacks -     busPIRone (BUSPAR) 5 MG tablet; Take 1 tablet (5 mg total) by mouth 2 (two) times daily. -     ALPRAZolam (XANAX) 0.5 MG tablet; Take 1 tablet (0.5 mg total) by mouth 2 (two) times daily as needed for anxiety.   Anxiety not controlled.  Discussed trying something else for anxiety than trintellix. Pt does not want to switch at this time. She feels like helping depression and no side effects. Will add buspar. Discussed side effects and how to use. Discussed good sleep. Encouraged sleep app to help her fall asleep. Discussed melatonin or unisom as well. She does not want to try any more medication. Discussed there were some options. Xanax refilled to use sparingly. Discussed how to ground herself during panic attack. Continue with counseling and exercise. Follow up in 3 months or sooner if no benefit.   Spent 30 minute with patient.

## 2020-01-22 NOTE — Patient Instructions (Signed)

## 2020-01-24 ENCOUNTER — Encounter: Payer: Self-pay | Admitting: Physician Assistant

## 2020-04-22 ENCOUNTER — Encounter: Payer: Self-pay | Admitting: Physician Assistant

## 2020-04-22 ENCOUNTER — Ambulatory Visit (INDEPENDENT_AMBULATORY_CARE_PROVIDER_SITE_OTHER): Payer: 59 | Admitting: Physician Assistant

## 2020-04-22 ENCOUNTER — Other Ambulatory Visit: Payer: Self-pay

## 2020-04-22 VITALS — BP 115/43 | HR 71 | Ht 62.0 in | Wt 122.0 lb

## 2020-04-22 DIAGNOSIS — Z113 Encounter for screening for infections with a predominantly sexual mode of transmission: Secondary | ICD-10-CM | POA: Diagnosis not present

## 2020-04-22 DIAGNOSIS — F411 Generalized anxiety disorder: Secondary | ICD-10-CM | POA: Diagnosis not present

## 2020-04-22 DIAGNOSIS — Z131 Encounter for screening for diabetes mellitus: Secondary | ICD-10-CM

## 2020-04-22 DIAGNOSIS — Z114 Encounter for screening for human immunodeficiency virus [HIV]: Secondary | ICD-10-CM

## 2020-04-22 DIAGNOSIS — F410 Panic disorder [episodic paroxysmal anxiety] without agoraphobia: Secondary | ICD-10-CM

## 2020-04-22 DIAGNOSIS — Z1159 Encounter for screening for other viral diseases: Secondary | ICD-10-CM

## 2020-04-22 DIAGNOSIS — Z13220 Encounter for screening for lipoid disorders: Secondary | ICD-10-CM

## 2020-04-22 MED ORDER — BUSPIRONE HCL 7.5 MG PO TABS: 7.5000 mg | ORAL_TABLET | Freq: Three times a day (TID) | ORAL | 3 refills | Status: DC

## 2020-04-22 MED ORDER — ALPRAZOLAM 0.5 MG PO TABS: 0.5000 mg | ORAL_TABLET | Freq: Two times a day (BID) | ORAL | 0 refills | Status: DC | PRN

## 2020-04-22 NOTE — Progress Notes (Signed)
Subjective:    Patient ID: Sierra Morrison, female    DOB: Feb 22, 1981, 39 y.o.   MRN: [id number]  HPI Patient is a 39 year old female with anxiety and panic attacks who presents to the clinic for follow-up.  She continues to go through a lot of life-changing events but she is overall doing very well.  The BuSpar has helped significantly with her anxiety.  The Trintellix is helping with her depression.  She had a little bit of GI upset when starting the BuSpar but otherwise she feels great.  She does feel like at times she has to take 3 times a day.  She wonders if she can get a higher dose to potentially try to take only twice a day.  She is exercising and meditating.  She is doing other mechanisms to help combat her anxiety.  She denies any suicidal thoughts or homicidal idealizations using xanax sparingly once a week.   .. 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      Objective:   Physical Exam Vitals reviewed.  Constitutional:      Appearance: Normal appearance.  HENT:     Head: Normocephalic.  Cardiovascular:     Rate and Rhythm: Normal  rate and regular rhythm.     Pulses: Normal pulses.     Heart sounds: Normal heart sounds.  Pulmonary:     Effort: Pulmonary effort is normal.     Breath sounds: Normal breath sounds.  Neurological:     General: No focal deficit present.     Mental Status: She is alert and oriented to person, place, and time.  Psychiatric:        Mood and Affect: Mood normal.       .. Depression screen [facility] 2/9 04/22/2020 01/22/2020 04/08/2019 02/08/2019 02/28/2017  Decreased Interest 0 1 0 0 0  Down, Depressed, Hopeless 1 2 0 0 0  PHQ - 2 Score 1 3 0 0 0  Altered sleeping 2 3 0 3 0  Tired, decreased energy 0 1 0 0 0  Change in appetite 0 0 0 0 0  Feeling bad or failure about yourself  0 1 0 0 0  Trouble concentrating 0 0 0 1 0  Moving slowly or fidgety/restless 0 0 0 1 0  Suicidal thoughts 0 0 0 0 0  PHQ-9 Score 3 8 0 5 0  Difficult doing work/chores Not difficult at all Somewhat difficult Not difficult at all Somewhat difficult -   .. GAD 7 : Generalized Anxiety Score 04/22/2020 01/22/2020 04/08/2019 02/08/2019  Nervous, Anxious, on Edge 2 3 1  3  Control/stop worrying 1 3 1 1   Worry too much - different things 1 3 0 1  Trouble relaxing 1 1 0 2  Restless 0 1 0 1  Easily annoyed or irritable 1 1 1 3   Afraid - awful might happen 0 0 0 1  Total GAD 7 Score 6 12 3 12   Anxiety Difficulty Somewhat difficult - Not difficult at all Somewhat difficult        Assessment & Plan:   Corrine was seen today for anxiety and depression.  Diagnoses and all orders for this visit:  Anxiety -     busPIRone (BUSPAR) 7.5 MG tablet; Take 1 tablet (7.5 mg total) by mouth 3 (three) times daily. -     CBC -     TSH  Panic attacks -     busPIRone (BUSPAR) 7.5 MG tablet; Take 1 tablet (7.5 mg total) by mouth 3 (three) times daily. -     ALPRAZolam (XANAX) 0.5 MG tablet; Take 1 tablet (0.5 mg total) by mouth 2 (two) times daily as needed for anxiety. -     CBC -     TSH  Screening examination for STD (sexually  transmitted disease) -     Hepatitis C Antibody -     HIV antibody (with reflex) -     RPR -     C. trachomatis/N. gonorrhoeae RNA  Screening for diabetes mellitus -     COMPLETE METABOLIC PANEL WITH GFR  Screening for lipid disorders -     Lipid Panel w/reflex Direct LDL  Encounter for hepatitis C screening test for low risk patient -     Hepatitis C Antibody  Screening for HIV (human immunodeficiency virus) -     HIV antibody (with reflex)   PHQ-9 and GAD 7 numbers have been cut in half.  I did increase the BuSpar to 7.5 mg up to 3 times a day.  Continue to take as needed.  Her maintenance dose could be twice a day and then the third only if needed.  There has been some concern of an affair.  She would like STD testing.  That was done today.  We added fasting labs as well. cmp for medication management.  Xanax refilled but she still has some left so rx was printed. She is using sparingly about once a week.  Follow-up in 6 months.   Xanax for 6 months.

## 2020-04-23 LAB — C. TRACHOMATIS/N. GONORRHOEAE RNA
C. trachomatis RNA, TMA: NOT DETECTED
N. gonorrhoeae RNA, TMA: NOT DETECTED

## 2020-04-24 NOTE — Progress Notes (Signed)
Sierra Morrison,   STI testing negative.

## 2020-05-01 LAB — COMPLETE METABOLIC PANEL WITH GFR
AG Ratio: 2 (calc) (ref 1.0–2.5)
ALT: 16 U/L (ref 6–29)
AST: 17 U/L (ref 10–30)
Albumin: 4.2 g/dL (ref 3.6–5.1)
Alkaline phosphatase (APISO): 25 U/L — ABNORMAL LOW (ref 31–125)
BUN: 8 mg/dL (ref 7–25)
CO2: 25 mmol/L (ref 20–32)
Calcium: 9 mg/dL (ref 8.6–10.2)
Chloride: 106 mmol/L (ref 98–110)
Creat: 0.86 mg/dL (ref 0.50–1.10)
GFR, Est African American: 100 mL/min/{1.73_m2} (ref >=60)
GFR, Est Non African American: 86 mL/min/{1.73_m2} (ref >=60)
Globulin: 2.1 g/dL (calc) (ref 1.9–3.7)
Glucose, Bld: 80 mg/dL (ref 65–99)
Potassium: 4.3 mmol/L (ref 3.5–5.3)
Sodium: 140 mmol/L (ref 135–146)
Total Bilirubin: 0.5 mg/dL (ref 0.2–1.2)
Total Protein: 6.3 g/dL (ref 6.1–8.1)

## 2020-05-01 LAB — LIPID PANEL W/REFLEX DIRECT LDL
Cholesterol: 189 mg/dL (ref <200)
HDL: 63 mg/dL (ref >=50)
LDL Cholesterol (Calc): 107 mg/dL (calc) — ABNORMAL HIGH
Non-HDL Cholesterol (Calc): 126 mg/dL (calc) (ref <130)
Total CHOL/HDL Ratio: 3 (calc) (ref <5.0)
Triglycerides: 97 mg/dL (ref <150)

## 2020-05-01 LAB — HEPATITIS C ANTIBODY
Hepatitis C Ab: NONREACTIVE
SIGNAL TO CUT-OFF: 0 (ref <1.00)

## 2020-05-01 LAB — HIV ANTIBODY (ROUTINE TESTING W REFLEX): HIV 1&2 Ab, 4th Generation: NONREACTIVE

## 2020-05-01 LAB — CBC
HCT: 37.4 % (ref 35.0–45.0)
Hemoglobin: 12.6 g/dL (ref 11.7–15.5)
MCH: 32.4 pg (ref 27.0–33.0)
MCHC: 33.7 g/dL (ref 32.0–36.0)
MCV: 96.1 fL (ref 80.0–100.0)
MPV: 11.6 fL (ref 7.5–12.5)
Platelets: 236 10*3/uL (ref 140–400)
RBC: 3.89 10*6/uL (ref 3.80–5.10)
RDW: 12.1 % (ref 11.0–15.0)
WBC: 4.7 10*3/uL (ref 3.8–10.8)

## 2020-05-01 LAB — TSH: TSH: 3.55 mIU/L

## 2020-05-01 LAB — RPR: RPR Ser Ql: NONREACTIVE

## 2020-05-01 NOTE — Progress Notes (Signed)
[name],   Not anemic.  Kidney, liver, glucose look good.  Cholesterol looks good with LDL just hair above optimal but HDL great.  Thyroid normal.

## 2020-05-01 NOTE — Progress Notes (Signed)
STD testing negative

## 2020-05-11 ENCOUNTER — Other Ambulatory Visit: Payer: Self-pay | Admitting: Physician Assistant

## 2020-05-11 DIAGNOSIS — F419 Anxiety disorder, unspecified: Secondary | ICD-10-CM

## 2020-05-11 DIAGNOSIS — F410 Panic disorder [episodic paroxysmal anxiety] without agoraphobia: Secondary | ICD-10-CM

## 2020-09-01 ENCOUNTER — Other Ambulatory Visit: Payer: Self-pay | Admitting: Physician Assistant

## 2020-09-01 DIAGNOSIS — F410 Panic disorder [episodic paroxysmal anxiety] without agoraphobia: Secondary | ICD-10-CM

## 2020-09-01 DIAGNOSIS — F411 Generalized anxiety disorder: Secondary | ICD-10-CM

## 2021-01-13 ENCOUNTER — Other Ambulatory Visit: Payer: Self-pay | Admitting: Physician Assistant

## 2021-01-13 DIAGNOSIS — F410 Panic disorder [episodic paroxysmal anxiety] without agoraphobia: Secondary | ICD-10-CM

## 2021-01-13 DIAGNOSIS — F411 Generalized anxiety disorder: Secondary | ICD-10-CM

## 2021-02-12 ENCOUNTER — Other Ambulatory Visit: Payer: Self-pay | Admitting: Physician Assistant

## 2021-02-12 DIAGNOSIS — F411 Generalized anxiety disorder: Secondary | ICD-10-CM

## 2021-02-12 DIAGNOSIS — F410 Panic disorder [episodic paroxysmal anxiety] without agoraphobia: Secondary | ICD-10-CM

## 2021-02-13 ENCOUNTER — Other Ambulatory Visit: Payer: Self-pay | Admitting: Physician Assistant

## 2021-02-13 DIAGNOSIS — F325 Major depressive disorder, single episode, in full remission: Secondary | ICD-10-CM

## 2021-02-13 DIAGNOSIS — F419 Anxiety disorder, unspecified: Secondary | ICD-10-CM

## 2021-04-23 ENCOUNTER — Encounter: Payer: 59 | Admitting: Physician Assistant

## 2021-05-10 ENCOUNTER — Encounter: Payer: Self-pay | Admitting: Physician Assistant

## 2021-05-10 ENCOUNTER — Ambulatory Visit (INDEPENDENT_AMBULATORY_CARE_PROVIDER_SITE_OTHER): Payer: 59 | Admitting: Physician Assistant

## 2021-05-10 ENCOUNTER — Other Ambulatory Visit: Payer: Self-pay

## 2021-05-10 VITALS — BP 112/62 | HR 68 | Ht 62.0 in | Wt 123.0 lb

## 2021-05-10 DIAGNOSIS — F410 Panic disorder [episodic paroxysmal anxiety] without agoraphobia: Secondary | ICD-10-CM

## 2021-05-10 DIAGNOSIS — F419 Anxiety disorder, unspecified: Secondary | ICD-10-CM

## 2021-05-10 DIAGNOSIS — A6004 Herpesviral vulvovaginitis: Secondary | ICD-10-CM | POA: Diagnosis not present

## 2021-05-10 DIAGNOSIS — F325 Major depressive disorder, single episode, in full remission: Secondary | ICD-10-CM

## 2021-05-10 DIAGNOSIS — Z0000 Encounter for general adult medical examination without abnormal findings: Secondary | ICD-10-CM | POA: Diagnosis not present

## 2021-05-10 DIAGNOSIS — F411 Generalized anxiety disorder: Secondary | ICD-10-CM

## 2021-05-10 DIAGNOSIS — Z803 Family history of malignant neoplasm of breast: Secondary | ICD-10-CM

## 2021-05-10 MED ORDER — BUSPIRONE HCL 7.5 MG PO TABS: 7.5000 mg | ORAL_TABLET | Freq: Three times a day (TID) | ORAL | 3 refills | Status: DC

## 2021-05-10 MED ORDER — VORTIOXETINE HBR 10 MG PO TABS: 10.0000 mg | ORAL_TABLET | Freq: Every day | ORAL | 3 refills | Status: DC

## 2021-05-10 MED ORDER — VALACYCLOVIR HCL 1 G PO TABS: 1000.0000 mg | ORAL_TABLET | Freq: Every day | ORAL | 3 refills | Status: DC

## 2021-05-10 MED ORDER — ALPRAZOLAM 0.5 MG PO TABS: 0.5000 mg | ORAL_TABLET | Freq: Two times a day (BID) | ORAL | 1 refills | Status: DC | PRN

## 2021-05-10 NOTE — Patient Instructions (Signed)
Health Maintenance, Female Adopting a healthy lifestyle and getting preventive care are important in promoting health and wellness. Ask your health care provider about: The right schedule for you to have regular tests and exams. Things you can do on your own to prevent diseases and keep yourself healthy. What should I know about diet, weight, and exercise? Eat a healthy diet  Eat a diet that includes plenty of vegetables, fruits, low-fat dairy products, and lean protein. Do not eat a lot of foods that are high in solid fats, added sugars, or sodium.  Maintain a healthy weight Body mass index (BMI) is used to identify weight problems. It estimates body fat based on height and weight. Your health care provider can help determineyour BMI and help you achieve or maintain a healthy weight. Get regular exercise Get regular exercise. This is one of the most important things you can do for your health. Most adults should: Exercise for at least 150 minutes each week. The exercise should increase your heart rate and make you sweat (moderate-intensity exercise). Do strengthening exercises at least twice a week. This is in addition to the moderate-intensity exercise. Spend less time sitting. Even light physical activity can be beneficial. Watch cholesterol and blood lipids Have your blood tested for lipids and cholesterol at 38 years of age, then havethis test every 5 years. Have your cholesterol levels checked more often if: Your lipid or cholesterol levels are high. You are older than 38 years of age. You are at high risk for heart disease. What should I know about cancer screening? Depending on your health history and family history, you may need to have cancer screening at various ages. This may include screening for: Breast cancer. Cervical cancer. Colorectal cancer. Skin cancer. Lung cancer. What should I know about heart disease, diabetes, and high blood pressure? Blood pressure and heart  disease High blood pressure causes heart disease and increases the risk of stroke. This is more likely to develop in people who have high blood pressure readings, are of African descent, or are overweight. Have your blood pressure checked: Every 3-5 years if you are 18-39 years of age. Every year if you are 40 years old or older. Diabetes Have regular diabetes screenings. This checks your fasting blood sugar level. Have the screening done: Once every three years after age 40 if you are at a normal weight and have a low risk for diabetes. More often and at a younger age if you are overweight or have a high risk for diabetes. What should I know about preventing infection? Hepatitis B If you have a higher risk for hepatitis B, you should be screened for this virus. Talk with your health care provider to find out if you are at risk forhepatitis B infection. Hepatitis C Testing is recommended for: Everyone born from 1945 through 1965. Anyone with known risk factors for hepatitis C. Sexually transmitted infections (STIs) Get screened for STIs, including gonorrhea and chlamydia, if: You are sexually active and are younger than 38 years of age. You are older than 38 years of age and your health care provider tells you that you are at risk for this type of infection. Your sexual activity has changed since you were last screened, and you are at increased risk for chlamydia or gonorrhea. Ask your health care provider if you are at risk. Ask your health care provider about whether you are at high risk for HIV. Your health care provider may recommend a prescription medicine to help   prevent HIV infection. If you choose to take medicine to prevent HIV, you should first get tested for HIV. You should then be tested every 3 months for as long as you are taking the medicine. Pregnancy If you are about to stop having your period (premenopausal) and you may become pregnant, seek counseling before you get  pregnant. Take 400 to 800 micrograms (mcg) of folic acid every day if you become pregnant. Ask for birth control (contraception) if you want to prevent pregnancy. Osteoporosis and menopause Osteoporosis is a disease in which the bones lose minerals and strength with aging. This can result in bone fractures. If you are 65 years old or older, or if you are at risk for osteoporosis and fractures, ask your health care provider if you should: Be screened for bone loss. Take a calcium or vitamin D supplement to lower your risk of fractures. Be given hormone replacement therapy (HRT) to treat symptoms of menopause. Follow these instructions at home: Lifestyle Do not use any products that contain nicotine or tobacco, such as cigarettes, e-cigarettes, and chewing tobacco. If you need help quitting, ask your health care provider. Do not use street drugs. Do not share needles. Ask your health care provider for help if you need support or information about quitting drugs. Alcohol use Do not drink alcohol if: Your health care provider tells you not to drink. You are pregnant, may be pregnant, or are planning to become pregnant. If you drink alcohol: Limit how much you use to 0-1 drink a day. Limit intake if you are breastfeeding. Be aware of how much alcohol is in your drink. In the [place] one drink equals one 12 oz bottle of beer (355 mL), one 5 oz glass of wine (148 mL), or one 1 oz glass of hard liquor (44 mL). General instructions Schedule regular health, dental, and eye exams. Stay current with your vaccines. Tell your health care provider if: You often feel depressed. You have ever been abused or do not feel safe at home. Summary Adopting a healthy lifestyle and getting preventive care are important in promoting health and wellness. Follow your health care provider's instructions about healthy diet, exercising, and getting tested or screened for diseases. Follow your health care provider's  instructions on monitoring your cholesterol and blood pressure. This information is not intended to replace advice given to you by your health care provider. Make sure you discuss any questions you have with your healthcare provider. Document Revised: 09/26/2018 Document Reviewed: 09/26/2018 Elsevier Patient Education  2022 Elsevier Inc.  

## 2021-05-10 NOTE — Progress Notes (Signed)
preSubjective:     Sierra Morrison is a 40 y.o. female and is here for a comprehensive physical exam. The patient reports no problems.  Mother BC at 23. Pt was BRCA negative.    Family History  Problem Relation Age of Onset   Cancer Mother        breast/skin   Cancer Maternal Grandmother        breast   Cancer Maternal Aunt        breast     Social History   Socioeconomic History   Marital status: Married    Spouse name: Not on file   Number of children: Not on file   Years of education: Not on file   Highest education level: Not on file  Occupational History   Occupation: admission coordinator    Employer: [employer]  Tobacco Use   Smoking status: Never   Smokeless tobacco: Never  Substance and Sexual Activity   Alcohol use: Yes    Alcohol/week: 1.0 standard drink    Types: 1 Glasses of wine per week    Comment: occassionally   Drug use: No   Sexual activity: Yes    Partners: Male    Birth control/protection: Pill  Other Topics Concern   Not on file  Social History Narrative   Not on file   Social Determinants of Health   Financial Resource Strain: Not on file  Food Insecurity: Not on file  Transportation Needs: Not on file  Physical Activity: Not on file  Stress: Not on file  Social Connections: Not on file  Intimate Partner Violence: Not on file   Health Maintenance  Topic Date Due   PAP SMEAR-Modifier  01/18/2019   COVID-19 Vaccine (3 - Booster for Pfizer series) 05/10/2020   INFLUENZA VACCINE  05/17/2021   TETANUS/TDAP  05/24/2026   Hepatitis C Screening  Completed   HIV Screening  Completed   Pneumococcal Vaccine 81-26 Years old  Aged Out   HPV VACCINES  Aged Out    The following portions of the patient's history were reviewed and updated as appropriate: allergies, current medications, past family history, past medical history, past social history, past surgical history, and problem list.  Review of Systems A comprehensive  review of systems was negative.   Objective:    BP 112/62   Pulse 68   Ht 5' 2" (1.575 m)   Wt 123 lb (55.8 kg)   SpO2 100%   BMI 22.50 kg/m  General appearance: alert, cooperative, and appears stated age Head: Normocephalic, without obvious abnormality, atraumatic Eyes: conjunctivae/corneas clear. PERRL, EOM's intact. Fundi benign. Ears: normal TM's and external ear canals both ears Nose: Nares normal. Septum midline. Mucosa normal. No drainage or sinus tenderness. Throat: lips, mucosa, and tongue normal; teeth and gums normal Neck: no adenopathy, no carotid bruit, no JVD, supple, symmetrical, trachea midline, and thyroid not enlarged, symmetric, no tenderness/mass/nodules Back: symmetric, no curvature. ROM normal. No CVA tenderness. Lungs: clear to auscultation bilaterally Heart: regular rate and rhythm, S1, S2 normal, no murmur, click, rub or gallop Abdomen: soft, non-tender; bowel sounds normal; no masses,  no organomegaly Extremities: extremities normal, atraumatic, no cyanosis or edema Pulses: 2+ and symmetric Skin: Skin color, texture, turgor normal. No rashes or lesions Lymph nodes: Cervical, supraclavicular, and axillary nodes normal. Neurologic: Grossly normal   .. Depression screen [facility] 2/9 05/10/2021 04/22/2020 01/22/2020 04/08/2019 02/08/2019  Decreased Interest 1 0 1 0 0  Down, Depressed, Hopeless _0 0 0  PHQ - 2 Score 2 1 3 0 0  Altered sleeping 1 2 3 0 3  Tired, decreased energy 0 0 1 0 0  Change in appetite 0 0 0 0 0  Feeling bad or failure about yourself  1 0 1 0 0  Trouble concentrating 1 0 0 0 1  Moving slowly or fidgety/restless 1 0 0 0 1  Suicidal thoughts 0 0 0 0 0  PHQ-9 Score 6 3 8 0 5  Difficult doing work/chores Somewhat difficult Not difficult at all Somewhat difficult Not difficult at all Somewhat difficult   .. GAD 7 : Generalized Anxiety Score 05/10/2021 04/22/2020 01/22/2020 04/08/2019  Nervous, Anxious, on Edge 1 2 3 1  Control/stop worrying 1 1 3 1   Worry too much - different things 1 1 3 0  Trouble relaxing 1 1 1 0  Restless 1 0 1 0  Easily annoyed or irritable 2 1 1 1  Afraid - awful might happen 1 0 0 0  Total GAD 7 Score 8 6 12 3  Anxiety Difficulty Somewhat difficult Somewhat difficult - Not difficult at all     Assessment:    Healthy female exam.     Plan:     ..[name] was seen today for annual exam.  Diagnoses and all orders for this visit:  Routine physical examination -     COMPLETE METABOLIC PANEL WITH GFR -     TSH -     CBC with Differential/Platelet -     Lipid Panel w/reflex Direct LDL  GAD (generalized anxiety disorder) -     busPIRone (BUSPAR) 7.5 MG tablet; Take 1 tablet (7.5 mg total) by mouth 3 (three) times daily.  Panic attacks -     busPIRone (BUSPAR) 7.5 MG tablet; Take 1 tablet (7.5 mg total) by mouth 3 (three) times daily. -     ALPRAZolam (XANAX) 0.5 MG tablet; Take 1 tablet (0.5 mg total) by mouth 2 (two) times daily as needed for anxiety.  Herpes simplex vulvovaginitis -     valACYclovir (VALTREX) 1000 MG tablet; Take 1 tablet (1,000 mg total) by mouth daily.  Anxiety -     vortioxetine HBr (TRINTELLIX) 10 MG TABS tablet; Take 1 tablet (10 mg total) by mouth daily.  Major depressive disorder with single episode, in full remission (HCC) -     vortioxetine HBr (TRINTELLIX) 10 MG TABS tablet; Take 1 tablet (10 mg total) by mouth daily.  .. Discussed 150 minutes of exercise a week.  Encouraged vitamin D 1000 units and Calcium 1300mg or 4 servings of dairy a day.  PHQ/GAD numbers look great.  Refilled trintellix and buspar with as needed xanax.  Fasting labs ordered today.  Need for pap. Will schedule with GYN.  Mammogram at 41. Due to increase risk of BC.  Covid x2.  See After Visit Summary for Counseling Recommendations   

## 2021-06-05 ENCOUNTER — Other Ambulatory Visit: Payer: Self-pay | Admitting: Physician Assistant

## 2021-06-05 DIAGNOSIS — F325 Major depressive disorder, single episode, in full remission: Secondary | ICD-10-CM

## 2021-06-05 DIAGNOSIS — F419 Anxiety disorder, unspecified: Secondary | ICD-10-CM

## 2021-07-04 ENCOUNTER — Other Ambulatory Visit: Payer: Self-pay | Admitting: Physician Assistant

## 2021-07-04 DIAGNOSIS — F411 Generalized anxiety disorder: Secondary | ICD-10-CM

## 2021-07-04 DIAGNOSIS — F410 Panic disorder [episodic paroxysmal anxiety] without agoraphobia: Secondary | ICD-10-CM

## 2021-07-27 ENCOUNTER — Other Ambulatory Visit: Payer: Self-pay | Admitting: Physician Assistant

## 2021-07-27 DIAGNOSIS — F410 Panic disorder [episodic paroxysmal anxiety] without agoraphobia: Secondary | ICD-10-CM

## 2021-07-27 DIAGNOSIS — F411 Generalized anxiety disorder: Secondary | ICD-10-CM

## 2021-11-30 ENCOUNTER — Telehealth: Payer: Self-pay

## 2021-11-30 NOTE — Telephone Encounter (Addendum)
vortioxetine HBr (TRINTELLIX) 10 MG TABS  Initiated Prior authorization for: Via: Covermymeds Case/Key: [id number] - [place] Case ID: [id number]  Status: approved as of 2/14/223 Reason:Approved through 11/24/21-11/24/22   Notified Pt via: Mychart

## 2021-12-16 ENCOUNTER — Other Ambulatory Visit: Payer: Self-pay | Admitting: Physician Assistant

## 2021-12-16 DIAGNOSIS — F410 Panic disorder [episodic paroxysmal anxiety] without agoraphobia: Secondary | ICD-10-CM

## 2021-12-16 DIAGNOSIS — F411 Generalized anxiety disorder: Secondary | ICD-10-CM

## 2022-05-31 ENCOUNTER — Other Ambulatory Visit: Payer: Self-pay | Admitting: Physician Assistant

## 2022-05-31 DIAGNOSIS — F325 Major depressive disorder, single episode, in full remission: Secondary | ICD-10-CM

## 2022-05-31 DIAGNOSIS — F419 Anxiety disorder, unspecified: Secondary | ICD-10-CM

## 2022-08-05 ENCOUNTER — Ambulatory Visit: Payer: 59 | Admitting: Physician Assistant

## 2022-08-12 ENCOUNTER — Encounter: Payer: Self-pay | Admitting: Physician Assistant

## 2022-08-12 ENCOUNTER — Ambulatory Visit: Payer: 59 | Admitting: Physician Assistant

## 2022-08-12 VITALS — BP 101/57 | HR 80 | Ht 62.0 in | Wt 123.0 lb

## 2022-08-12 DIAGNOSIS — Z79899 Other long term (current) drug therapy: Secondary | ICD-10-CM | POA: Diagnosis not present

## 2022-08-12 DIAGNOSIS — Z13220 Encounter for screening for lipoid disorders: Secondary | ICD-10-CM | POA: Diagnosis not present

## 2022-08-12 DIAGNOSIS — F419 Anxiety disorder, unspecified: Secondary | ICD-10-CM

## 2022-08-12 DIAGNOSIS — Z1329 Encounter for screening for other suspected endocrine disorder: Secondary | ICD-10-CM

## 2022-08-12 DIAGNOSIS — A6004 Herpesviral vulvovaginitis: Secondary | ICD-10-CM

## 2022-08-12 DIAGNOSIS — F410 Panic disorder [episodic paroxysmal anxiety] without agoraphobia: Secondary | ICD-10-CM

## 2022-08-12 DIAGNOSIS — F325 Major depressive disorder, single episode, in full remission: Secondary | ICD-10-CM

## 2022-08-12 DIAGNOSIS — Z23 Encounter for immunization: Secondary | ICD-10-CM | POA: Diagnosis not present

## 2022-08-12 DIAGNOSIS — Z131 Encounter for screening for diabetes mellitus: Secondary | ICD-10-CM

## 2022-08-12 MED ORDER — VORTIOXETINE HBR 20 MG PO TABS: 20.0000 mg | ORAL_TABLET | Freq: Every day | ORAL | 3 refills | Status: DC

## 2022-08-12 MED ORDER — VALACYCLOVIR HCL 1 G PO TABS: 1000.0000 mg | ORAL_TABLET | Freq: Every day | ORAL | 3 refills | Status: DC

## 2022-08-12 MED ORDER — ALPRAZOLAM 0.5 MG PO TABS: 0.5000 mg | ORAL_TABLET | Freq: Two times a day (BID) | ORAL | 1 refills | Status: DC | PRN

## 2022-08-12 NOTE — Progress Notes (Signed)
Established Patient Office Visit  Subjective   Patient ID: Sierra Morrison, female    DOB: [birth year]  Age: 41 y.o. MRN: [id number]  Chief Complaint  Patient presents with   Anxiety    HPI Pt is a 41 yo female with MDD, anxiety, herpes who presents to the clinic for medication refills who presents to the clinic for medication refills.   Needs valtrex for suppression of herpes.   Anxiety and depression are fairly well controlled. No SI/HC. She has not felt like buspar was really helping. She takes xanax 1-2 times every 6-8 weeks. She is interested in increasing trintellix. She does yoga regularly and sees a Veterinary surgeon.   .. 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   Panic attacks 02/08/2019   Trouble in sleeping 02/08/2019   Herpes simplex vulvovaginitis 05/10/2021   GAD (generalized anxiety disorder) 05/10/2021   Resolved Ambulatory Problems    Diagnosis Date Noted   Pregnancy 01/04/2016   Supervision of low-risk first pregnancy 01/18/2016   GBS bacteriuria 01/22/2016   [redacted] weeks gestation of pregnancy    Abnormal findings on antenatal screening    Indication for care or intervention related to labor and delivery 08/05/2016   Past Medical History:  Diagnosis Date   History of abnormal cervical Pap smear    Infertility, female    Left ovarian cyst    Wears glasses        Review of Systems  All other systems reviewed and are negative.     Objective:     BP (!) 101/57   Pulse 80   Ht 5\' 2"  (1.575 m)   Wt 123 lb (55.8 kg)   SpO2 100%   BMI 22.50 kg/m  BP Readings from Last 3 Encounters:  08/12/22 (!) 101/57  05/10/21 112/62  04/22/20 (!) 115/43   Wt Readings from Last 3 Encounters:  08/12/22 123 lb (55.8 kg)  05/10/21  123 lb (55.8 kg)  04/22/20 122 lb (55.3 kg)    .06/23/20    08/12/2022    8:48 AM 05/10/2021    2:42 PM 04/22/2020    8:38 AM 01/22/2020    9:11 AM 04/08/2019   10:23 AM  Depression screen PHQ 2/9  Decreased Interest 1 1 0 1 0  Down, Depressed, Hopeless 1 1 1 2  0  PHQ - 2 Score 2 2 1 3  0  Altered sleeping 1 1 2 3  0  Tired, decreased energy 1 0 0 1 0  Change in appetite 0 0 0 0 0  Feeling bad or failure about yourself  1 1 0 1 0  Trouble concentrating 1 1 0 0 0  Moving slowly or fidgety/restless 1 1 0 0 0  Suicidal thoughts 0 0 0 0 0  PHQ-9 Score 7 6 3 8  0  Difficult doing work/chores Somewhat difficult Somewhat difficult Not difficult at all Somewhat difficult Not difficult at all   .4/9    08/12/2022    8:48 AM 05/10/2021    2:42 PM 04/22/2020    8:38 AM 01/22/2020    9:11 AM  GAD 7 : Generalized Anxiety Score  Nervous, Anxious, on Edge 1 1 2 3   Control/stop worrying 1 1 1 3   Worry too much - different things 1 1 1  3  Trouble relaxing 1 1 1 1   Restless 1 1 0 1  Easily annoyed or irritable 2 2 1 1   Afraid - awful might happen 0 1 0 0  Total GAD 7 Score 7 8 6 12   Anxiety Difficulty Somewhat difficult Somewhat difficult Somewhat difficult       Physical Exam Constitutional:      Appearance: Normal appearance.  Cardiovascular:     Rate and Rhythm: Normal rate and regular rhythm.  Pulmonary:     Effort: Pulmonary effort is normal.     Breath sounds: Normal breath sounds.  Neurological:     General: No focal deficit present.     Mental Status: She is alert and oriented to person, place, and time.  Psychiatric:        Mood and Affect: Mood normal.         Assessment & Plan:  Marland KitchenMarland KitchenLudene was seen today for anxiety.  Diagnoses and all orders for this visit:  Major depressive disorder with single episode, in full remission ([facility]) -     vortioxetine HBr (TRINTELLIX) 20 MG TABS tablet; Take 1 tablet (20 mg total) by mouth daily.  Anxiety -     ALPRAZolam (XANAX) 0.5 MG tablet;  Take 1 tablet (0.5 mg total) by mouth 2 (two) times daily as needed for anxiety. -     vortioxetine HBr (TRINTELLIX) 20 MG TABS tablet; Take 1 tablet (20 mg total) by mouth daily.  Medication management -     TSH -     Lipid Panel w/reflex Direct LDL -     COMPLETE METABOLIC PANEL WITH GFR -     CBC with Differential/Platelet  Lipid screening -     Lipid Panel w/reflex Direct LDL  Thyroid disorder screen -     TSH  Diabetes mellitus screening -     COMPLETE METABOLIC PANEL WITH GFR  Herpes simplex vulvovaginitis -     valACYclovir (VALTREX) 1000 MG tablet; Take 1 tablet (1,000 mg total) by mouth daily.  Panic attacks -     ALPRAZolam (XANAX) 0.5 MG tablet; Take 1 tablet (0.5 mg total) by mouth 2 (two) times daily as needed for anxiety. -     vortioxetine HBr (TRINTELLIX) 20 MG TABS tablet; Take 1 tablet (20 mg total) by mouth daily.  Need for immunization against influenza -     Flu Vaccine QUAD 48mo+IM (Fluarix, Fluzone & Alfiuria Quad PF)   PHQ/GAD stable but not quite to goal Increased trintellix to 20mg  daily Xanax refilled for as needed usage Fasting labs ordered Flu shot given today   Return in about 1 year (around 08/13/2023).    Iran Planas, PA-C

## 2023-01-12 ENCOUNTER — Other Ambulatory Visit: Payer: Self-pay | Admitting: Physician Assistant

## 2023-01-12 DIAGNOSIS — Z1231 Encounter for screening mammogram for malignant neoplasm of breast: Secondary | ICD-10-CM

## 2023-02-15 ENCOUNTER — Ambulatory Visit (INDEPENDENT_AMBULATORY_CARE_PROVIDER_SITE_OTHER): Payer: 59

## 2023-02-15 DIAGNOSIS — Z1231 Encounter for screening mammogram for malignant neoplasm of breast: Secondary | ICD-10-CM | POA: Diagnosis not present

## 2023-02-17 NOTE — Progress Notes (Signed)
Normal mammogram. Follow up in 1 year.

## 2023-09-13 ENCOUNTER — Other Ambulatory Visit: Payer: Self-pay | Admitting: Physician Assistant

## 2023-09-13 DIAGNOSIS — F419 Anxiety disorder, unspecified: Secondary | ICD-10-CM

## 2023-09-13 DIAGNOSIS — F410 Panic disorder [episodic paroxysmal anxiety] without agoraphobia: Secondary | ICD-10-CM

## 2023-09-13 DIAGNOSIS — F325 Major depressive disorder, single episode, in full remission: Secondary | ICD-10-CM

## 2023-09-18 ENCOUNTER — Telehealth: Payer: Self-pay

## 2023-09-18 NOTE — Telephone Encounter (Signed)
Copied from CRM [id number]. Topic: Clinical - Medication Refill >> Sep 18, 2023  9:44 AM Tiffany H wrote: Most Recent Primary Care Visit:  Provider: Jomarie Longs  Department: [facility]  Visit Type: OFFICE VISIT  Date: 08/12/2022  Medication: Trintellix 20mg  1x Xanax 0.5mg    Has the patient contacted their pharmacy? No (Agent: If no, request that the patient contact the pharmacy for the refill. If patient does not wish to contact the pharmacy document the reason why and proceed with request.) Patient has been skipping doses for a few days.  (Agent: If yes, when and what did the pharmacy advise?)  Is this the correct pharmacy for this prescription? Yes If no, delete pharmacy and type the correct one.  This is the patient's preferred pharmacy:  [id number] - [place], [place] - [address] [place] [address] Phone: [phone] Fax: [phone]    Has the prescription been filled recently? No  Is the patient out of the medication? Yes  Has the patient been seen for an appointment in the last year OR does the patient have an upcoming appointment? Yes  Can we respond through MyChart? Yes  Agent: Please be advised that Rx refills may take up to 3 business days. We ask that you follow-up with your pharmacy.

## 2023-09-18 NOTE — Telephone Encounter (Signed)
Sierra Morrison needs an appointment for refills. I sent in a 30 day supply of the Trintellix. I called and left a voicemail advising patient to call back and schedule an appointment.   Needs appointment for Xanax.

## 2023-09-19 ENCOUNTER — Telehealth: Payer: Self-pay

## 2023-09-19 ENCOUNTER — Ambulatory Visit: Payer: 59 | Admitting: Physician Assistant

## 2023-09-19 NOTE — Telephone Encounter (Signed)
Rx refill request completed on 09/18/23. Contacted the patient regarding the refill. Per patient, she received a msg from [facility] that the medication is currently out of stock. Patient's last dose will be tomorrow. Patient is going to wait and see if the shipment is received by the pharmacy in the next 2 days. If not, patient will call back on Thursday to switch pharmacies if needed. She has also confirmed to keep her upcoming appt with the provider.

## 2023-09-19 NOTE — Telephone Encounter (Signed)
Copied from CRM [id number]. Topic: Clinical - Medication Refill >> Sep 18, 2023  4:20 PM Hector Shade B wrote: Most Recent Primary Care Visit:  Provider: Jomarie Longs  Department: [facility]  Visit Type: OFFICE VISIT  Date: 08/12/2022  Medication: vortioxetine HBr (TRINTELLIX) 20 MG TABS tablet   Has the patient contacted their pharmacy? Yes (Agent: If no, request that the patient contact the pharmacy for the refill. If patient does not wish to contact the pharmacy document the reason why and proceed with request.) (Agent: If yes, when and what did the pharmacy advise?)  Is this the correct pharmacy for this prescription? Yes If no, delete pharmacy and type the correct one.  This is the patient's preferred pharmacy:  [id number] [place], [place] - [address] [place] [address] Phone: [phone] Fax: [phone]  [id number] - [place], [place] - [address] [place] [address] Phone: [phone] Fax: [phone]   Has the prescription been filled recently? Yes  Is the patient out of the medication? Yes  Has the patient been seen for an appointment in the last year OR does the patient have an upcoming appointment? Yes  Can we respond through MyChart? Yes  Agent: Please be advised that Rx refills may take up to 3 business days. We ask that you follow-up with your pharmacy.

## 2023-09-27 ENCOUNTER — Ambulatory Visit: Payer: 59 | Admitting: Physician Assistant

## 2023-09-27 ENCOUNTER — Other Ambulatory Visit (HOSPITAL_COMMUNITY)
Admission: RE | Admit: 2023-09-27 | Discharge: 2023-09-27 | Disposition: A | Discharge status: Home / Self Care | Payer: 59 | Source: Ambulatory Visit | Attending: Physician Assistant | Admitting: Physician Assistant

## 2023-09-27 ENCOUNTER — Encounter: Payer: Self-pay | Admitting: Physician Assistant

## 2023-09-27 VITALS — BP 108/75 | HR 72 | Resp 12 | Wt 124.5 lb

## 2023-09-27 DIAGNOSIS — Z0000 Encounter for general adult medical examination without abnormal findings: Secondary | ICD-10-CM

## 2023-09-27 DIAGNOSIS — Z124 Encounter for screening for malignant neoplasm of cervix: Secondary | ICD-10-CM | POA: Insufficient documentation

## 2023-09-27 DIAGNOSIS — Z13220 Encounter for screening for lipoid disorders: Secondary | ICD-10-CM | POA: Diagnosis not present

## 2023-09-27 DIAGNOSIS — Z131 Encounter for screening for diabetes mellitus: Secondary | ICD-10-CM | POA: Diagnosis not present

## 2023-09-27 DIAGNOSIS — F410 Panic disorder [episodic paroxysmal anxiety] without agoraphobia: Secondary | ICD-10-CM

## 2023-09-27 DIAGNOSIS — F419 Anxiety disorder, unspecified: Secondary | ICD-10-CM

## 2023-09-27 DIAGNOSIS — F325 Major depressive disorder, single episode, in full remission: Secondary | ICD-10-CM

## 2023-09-27 DIAGNOSIS — A6004 Herpesviral vulvovaginitis: Secondary | ICD-10-CM

## 2023-09-27 MED ORDER — VORTIOXETINE HBR 20 MG PO TABS
20.0000 mg | ORAL_TABLET | Freq: Every day | ORAL | 3 refills | Status: DC
Start: 2023-09-27 — End: 2024-08-27

## 2023-09-27 MED ORDER — ALPRAZOLAM 0.5 MG PO TABS
0.5000 mg | ORAL_TABLET | Freq: Two times a day (BID) | ORAL | 1 refills | Status: DC | PRN
Start: 2023-09-27 — End: 2024-08-27

## 2023-09-27 MED ORDER — VALACYCLOVIR HCL 1 G PO TABS: 1000.0000 mg | ORAL_TABLET | Freq: Every day | ORAL | 3 refills | Status: DC

## 2023-09-27 NOTE — Progress Notes (Signed)
Complete physical exam  Patient: Sierra Morrison   DOB: August 20, 1981   42 y.o. Female  MRN: [id number]  Subjective:    Chief Complaint  Patient presents with   Annual Exam   Abnormal Pap Smear    Sierra Morrison is a 42 y.o. female who presents today for a complete physical exam. She reports consuming a general diet. Gym/ health club routine includes yoga. She generally feels well. She reports sleeping well. She does not have additional problems to discuss today.    Most recent fall risk assessment:    09/27/2023   10:02 AM  Fall Risk   Falls in the past year? 0  Number falls in past yr: 0  Injury with Fall? 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   Endometriosis 01/18/2016   Vaginismus 10/25/2011   Anxiety 10/25/2011   Depression 10/25/2011   Family history of breast cancer in first degree relative 10/25/2011   Past Medical History:  Diagnosis Date   Anxiety    Depression    Endometriosis    History of abnormal cervical Pap smear    Infertility, female    Left ovarian cyst    Wears glasses       Patient Care Team: Sierra Morrison as PCP - General (Family Medicine)   Outpatient Medications Prior to Visit  Medication Sig   [DISCONTINUED] ALPRAZolam (XANAX) 0.5 MG tablet Take 1 tablet (0.5 mg total) by mouth 2 (two) times daily as needed for anxiety.   [DISCONTINUED] valACYclovir (VALTREX) 1000 MG tablet Take 1 tablet (1,000 mg total) by  mouth daily.   [DISCONTINUED] vortioxetine HBr (TRINTELLIX) 20 MG TABS tablet Take 1 tablet (20 mg total) by mouth daily. Needs appointment   No facility-administered medications prior to visit.    Review of Systems  Respiratory:  Negative for shortness of breath.   Cardiovascular:  Negative for chest pain and leg swelling.  Musculoskeletal:  Negative for joint pain.      Objective:     BP 108/75 (BP Location: Left Arm, Patient Position: Sitting)   Pulse 72   Resp 12   Wt 124 lb 8 oz (56.5 kg)   SpO2 100%   BMI 22.77 kg/m    Physical Exam Exam conducted with a chaperone present.  Constitutional:      Appearance: She is normal weight.  HENT:     Right Ear: Tympanic membrane, ear canal and external ear normal.     Left Ear: Tympanic membrane, ear canal and external ear normal.     Nose: Nose normal.     Mouth/Throat:     Mouth: Mucous membranes are moist.     Pharynx: No oropharyngeal exudate or posterior oropharyngeal erythema.  Eyes:     Conjunctiva/sclera: Conjunctivae normal.  Cardiovascular:     Rate and Rhythm: Normal rate and regular rhythm.     Pulses:  Normal pulses.     Heart sounds: Normal heart sounds.  Pulmonary:     Effort: Pulmonary effort is normal.     Breath sounds: Normal breath sounds.  Abdominal:     Hernia: There is no hernia in the left inguinal area or right inguinal area.  Genitourinary:    General: Normal vulva.     Pubic Area: No rash or pubic lice.      Labia:        Right: No rash, tenderness, lesion or injury.        Left: No rash, tenderness, lesion or injury.      Urethra: No prolapse, urethral pain, urethral swelling or urethral lesion.     Vagina: Normal. No signs of injury and foreign body. No vaginal discharge, erythema, tenderness, bleeding, lesions or prolapsed vaginal walls.     Cervix: Normal. No cervical motion tenderness, discharge, friability, lesion, erythema, cervical bleeding or eversion.     Uterus: Normal. Not  deviated, not enlarged, not fixed, not tender and no uterine prolapse.      Adnexa: Right adnexa normal and left adnexa normal.       Right: No mass, tenderness or fullness.         Left: No mass, tenderness or fullness.    Lymphadenopathy:     Lower Body: No right inguinal adenopathy. No left inguinal adenopathy.  Neurological:     Mental Status: She is alert.          Assessment & Plan:    Routine Health Maintenance and Physical Exam  Immunization History  Administered Date(s) Administered   Influenza,inj,Quad PF,6+ Mos 07/21/2016, 08/12/2022   Influenza-Unspecified 07/18/2015, 07/21/2016, 07/31/2023   PFIZER(Purple Top)SARS-COV-2 Vaccination 11/21/2019, 12/12/2019   Tdap 12/09/2011, 05/24/2016    Health Maintenance  Topic Date Due   Cervical Cancer Screening (HPV/Pap Cotest)  01/18/2019   COVID-19 Vaccine (3 - Pfizer risk series) 10/13/2023 (Originally 01/09/2020)   DTaP/Tdap/Td (3 - Td or Tdap) 05/24/2026   INFLUENZA VACCINE  Completed   Hepatitis C Screening  Completed   HIV Screening  Completed   HPV VACCINES  Aged Out    Discussed health benefits of physical activity, and encouraged her to engage in regular exercise appropriate for her age and condition. Marland KitchenBaxter Morrison was seen today for annual exam and abnormal pap smear.  Diagnoses and all orders for this visit:  Routine Papanicolaou smear -     Lipid panel -     CMP14+EGFR -     TSH -     CBC w/Diff/Platelet -     Cytology - PAP  Preventative health care -     Lipid panel -     CMP14+EGFR -     TSH -     CBC w/Diff/Platelet  Screening for lipid disorders -     Lipid panel  Screening for diabetes mellitus -     CMP14+EGFR  Herpes simplex vulvovaginitis -     valACYclovir (VALTREX) 1000 MG tablet; Take 1 tablet (1,000 mg total) by mouth daily.  Panic attacks -     ALPRAZolam (XANAX) 0.5 MG tablet; Take 1 tablet (0.5 mg total) by mouth 2 (two) times daily as needed for anxiety. -     vortioxetine HBr  (TRINTELLIX) 20 MG TABS tablet; Take 1 tablet (20 mg total) by mouth daily.  Anxiety -     ALPRAZolam (XANAX) 0.5 MG tablet; Take 1 tablet (0.5 mg total) by mouth 2 (two) times daily as needed for  anxiety. -     vortioxetine HBr (TRINTELLIX) 20 MG TABS tablet; Take 1 tablet (20 mg total) by mouth daily.  Major depressive disorder with single episode, in full remission (HCC) -     vortioxetine HBr (TRINTELLIX) 20 MG TABS tablet; Take 1 tablet (20 mg total) by mouth daily.     Patient doing well. Fasting labs ordered. GAD/PHQ to goal-refilled trintellix and xanax.   Declined breast exam-she is getting annual mammograms. Pap performed today. Flu vaccine administered today. Declined COVID vaccine.      Tandy Gaw, PA-C

## 2023-09-27 NOTE — Patient Instructions (Signed)

## 2023-09-28 LAB — CMP14+EGFR
ALT: 16 [IU]/L (ref 0–32)
AST: 16 [IU]/L (ref 0–40)
Albumin: 4.6 g/dL (ref 3.9–4.9)
Alkaline Phosphatase: 52 [IU]/L (ref 44–121)
BUN/Creatinine Ratio: 14 (ref 9–23)
BUN: 11 mg/dL (ref 6–24)
Bilirubin Total: 0.4 mg/dL (ref 0.0–1.2)
CO2: 24 mmol/L (ref 20–29)
Calcium: 9.2 mg/dL (ref 8.7–10.2)
Chloride: 104 mmol/L (ref 96–106)
Creatinine, Ser: 0.81 mg/dL (ref 0.57–1.00)
Globulin, Total: 2.1 g/dL (ref 1.5–4.5)
Glucose: 83 mg/dL (ref 70–99)
Potassium: 4.4 mmol/L (ref 3.5–5.2)
Sodium: 141 mmol/L (ref 134–144)
Total Protein: 6.7 g/dL (ref 6.0–8.5)
eGFR: 94 mL/min/{1.73_m2} (ref >59)

## 2023-09-28 LAB — TSH: TSH: 2.05 u[IU]/mL (ref 0.450–4.500)

## 2023-09-28 LAB — CBC WITH DIFFERENTIAL/PLATELET
Basophils Absolute: 0 10*3/uL (ref 0.0–0.2)
Basos: 0 %
EOS (ABSOLUTE): 0.1 10*3/uL (ref 0.0–0.4)
Eos: 1 %
Hematocrit: 38.9 % (ref 34.0–46.6)
Hemoglobin: 12.6 g/dL (ref 11.1–15.9)
Immature Grans (Abs): 0 10*3/uL (ref 0.0–0.1)
Immature Granulocytes: 0 %
Lymphocytes Absolute: 1.6 10*3/uL (ref 0.7–3.1)
Lymphs: 33 %
MCH: 29.9 pg (ref 26.6–33.0)
MCHC: 32.4 g/dL (ref 31.5–35.7)
MCV: 92 fL (ref 79–97)
Monocytes Absolute: 0.3 10*3/uL (ref 0.1–0.9)
Monocytes: 6 %
Neutrophils Absolute: 2.9 10*3/uL (ref 1.4–7.0)
Neutrophils: 60 %
Platelets: 248 10*3/uL (ref 150–450)
RBC: 4.22 x10E6/uL (ref 3.77–5.28)
RDW: 12.3 % (ref 11.7–15.4)
WBC: 5 10*3/uL (ref 3.4–10.8)

## 2023-09-28 LAB — LIPID PANEL
Chol/HDL Ratio: 3 {ratio} (ref 0.0–4.4)
Cholesterol, Total: 184 mg/dL (ref 100–199)
HDL: 62 mg/dL (ref >39)
LDL Chol Calc (NIH): 111 mg/dL — ABNORMAL HIGH (ref 0–99)
Triglycerides: 59 mg/dL (ref 0–149)
VLDL Cholesterol Cal: 11 mg/dL (ref 5–40)

## 2023-09-29 LAB — CYTOLOGY - PAP
Adequacy: ABSENT
Comment: NEGATIVE
Diagnosis: NEGATIVE
High risk HPV: NEGATIVE

## 2023-09-29 NOTE — Progress Notes (Signed)
Sierra Morrison,   HDL, good cholesterol, looks great.  LDL, bad cholesterol, is almost to optimal goal.   Overall cardiovascular risk is low. Continue to work on healthy diet and regular exercise and we will continue to monitor.   .The 10-year ASCVD risk score (Arnett DK, et al., 2019) is: 0.3%   Values used to calculate the score:     Age: 42 years     Sex: Female     Is Non-Hispanic African American: No     Diabetic: No     Tobacco smoker: No     Systolic Blood Pressure: 108 mmHg     Is BP treated: No     HDL Cholesterol: 62 mg/dL     Total Cholesterol: 184 mg/dL  Kidney, liver, glucose look great.  Thyroid looks great.  No anemia.

## 2024-08-14 ENCOUNTER — Other Ambulatory Visit: Payer: Self-pay | Admitting: Physician Assistant

## 2024-08-14 DIAGNOSIS — Z1231 Encounter for screening mammogram for malignant neoplasm of breast: Secondary | ICD-10-CM

## 2024-08-19 ENCOUNTER — Encounter: Admitting: Physician Assistant

## 2024-08-20 ENCOUNTER — Ambulatory Visit

## 2024-08-27 ENCOUNTER — Ambulatory Visit: Admitting: Physician Assistant

## 2024-08-27 ENCOUNTER — Encounter: Payer: Self-pay | Admitting: Physician Assistant

## 2024-08-27 VITALS — BP 101/62 | HR 57 | Ht 62.0 in | Wt 127.0 lb

## 2024-08-27 DIAGNOSIS — Z0000 Encounter for general adult medical examination without abnormal findings: Secondary | ICD-10-CM

## 2024-08-27 DIAGNOSIS — A6004 Herpesviral vulvovaginitis: Secondary | ICD-10-CM

## 2024-08-27 DIAGNOSIS — Z131 Encounter for screening for diabetes mellitus: Secondary | ICD-10-CM

## 2024-08-27 DIAGNOSIS — Z23 Encounter for immunization: Secondary | ICD-10-CM | POA: Diagnosis not present

## 2024-08-27 DIAGNOSIS — F325 Major depressive disorder, single episode, in full remission: Secondary | ICD-10-CM

## 2024-08-27 DIAGNOSIS — F419 Anxiety disorder, unspecified: Secondary | ICD-10-CM

## 2024-08-27 DIAGNOSIS — F411 Generalized anxiety disorder: Secondary | ICD-10-CM

## 2024-08-27 DIAGNOSIS — L905 Scar conditions and fibrosis of skin: Secondary | ICD-10-CM

## 2024-08-27 DIAGNOSIS — Z8614 Personal history of Methicillin resistant Staphylococcus aureus infection: Secondary | ICD-10-CM | POA: Insufficient documentation

## 2024-08-27 DIAGNOSIS — R239 Unspecified skin changes: Secondary | ICD-10-CM | POA: Insufficient documentation

## 2024-08-27 DIAGNOSIS — Z13220 Encounter for screening for lipoid disorders: Secondary | ICD-10-CM

## 2024-08-27 DIAGNOSIS — F410 Panic disorder [episodic paroxysmal anxiety] without agoraphobia: Secondary | ICD-10-CM

## 2024-08-27 MED ORDER — VORTIOXETINE HBR 20 MG PO TABS: 20.0000 mg | ORAL_TABLET | Freq: Every day | ORAL | 3 refills | Status: AC

## 2024-08-27 MED ORDER — ALPRAZOLAM 0.5 MG PO TABS: 0.5000 mg | ORAL_TABLET | Freq: Two times a day (BID) | ORAL | 1 refills | Status: AC | PRN

## 2024-08-27 MED ORDER — VALACYCLOVIR HCL 1 G PO TABS: 1000.0000 mg | ORAL_TABLET | Freq: Every day | ORAL | 3 refills | Status: AC

## 2024-08-27 NOTE — Progress Notes (Signed)
 Complete physical exam  Patient: Sierra Morrison   DOB: [birth year]   43 y.o. Female  MRN: [id number]  Subjective:    Chief Complaint  Patient presents with   Annual Exam  ..Discussed the use of AI scribe software for clinical note transcription with the patient, who gave verbal consent to proceed.  History of Present Illness Sierra Morrison is a 43 year old female who presents for a routine follow-up visit.  Blood pressure and orthostatic symptoms - Blood pressure typically low without associated dizziness upon standing - Occasional dizziness attributed to dehydration, managed with increased fluid and salt intake  Preventive health maintenance - Second mammogram scheduled for next week - Pap smear up to date - Received influenza vaccine - Has not received COVID vaccine  Anxiety and psychiatric medication use - Uses Xanax  on an as-needed basis, approximately once weekly, for anxiety - Takes Trintellix  20 mg daily - Mood is stable  Herpes simplex virus management - Uses Valtrex  as needed for herpes outbreaks, typically during periods of stress or immune compromise  History of methicillin-resistant staphylococcus aureus (mrsa) infection and dermatologic concerns - Experienced MRSA infection on face during past summer - Simplified skincare routine due to breakouts following infection - Concerned about lingering effects of MRSA infection - Interested in dermatology referral for skin care and skin cancer screening due to fair skin and family history of skin cancer  Ophthalmologic health - Wears glasses - Has not had recent eye examination - Acknowledges need for eye exam  Dental health - Regular dental visits - Dental appointment scheduled for next week  Gastrointestinal and bowel habits - No changes in bowel habits  Physical activity - Increased physical activity with focus on cardio and strength training using Peloton app and yoga - Incorporated light weights  into exercise routine over past six months    Most recent fall risk assessment:    08/27/2024    1:05 PM  Fall Risk   Falls in the past year? 0  Number falls in past yr: 0  Injury with Fall? 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   Endometriosis 01/18/2016   Vaginismus 10/25/2011   Anxiety 10/25/2011   Depression 10/25/2011   Family history of breast cancer in first degree relative 10/25/2011   Past Medical History:  Diagnosis Date   Anxiety    Depression    Endometriosis    History of abnormal cervical Pap smear    Infertility, female    Left ovarian cyst    Wears glasses    Past Surgical History:  Procedure Laterality Date   LAPAROSCOPIC OVARIAN CYSTECTOMY Left 03/25/2015   Procedure: LAPAROSCOPIC GELPORT ASSISTED LEFT OVARIAN CYSTECTOMY/EXCISION AND ABLATION OF ENDOMETROSIS/LYSIS OF ADHESIONS/CHROMOTUBATION;  Surgeon: Cynthia Loss, MD;  Location: [facility];  Service: Gynecology;  Laterality: Left;   MOLE REMOVAL  2003   spit nevi   NASAL SEPTUM SURGERY  2007   WISDOM TOOTH EXTRACTION  age 74   Family History  Problem Relation Age of Onset   Cancer Mother        breast/skin   Cancer Maternal Grandmother        breast   Cancer Maternal Aunt        breast   No Known Allergies    Patient Care Team: Varshini Arrants L, PA-C as PCP  - General (Family Medicine)   Outpatient Medications Prior to Visit  Medication Sig   [DISCONTINUED] ALPRAZolam  (XANAX ) 0.5 MG tablet Take 1 tablet (0.5 mg total) by mouth 2 (two) times daily as needed for anxiety.   [DISCONTINUED] valACYclovir  (VALTREX ) 1000 MG tablet Take 1 tablet (1,000 mg total) by mouth daily.   [DISCONTINUED] vortioxetine  HBr (TRINTELLIX ) 20 MG TABS tablet Take 1 tablet (20 mg total) by mouth daily.   No facility-administered medications prior to visit.    Review of Systems  All other systems reviewed and are negative.         Objective:     BP 101/62   Pulse (!) 57   Ht 5' 2 (1.575 m)   Wt 127 lb (57.6 kg)   SpO2 100%   BMI 23.23 kg/m  BP Readings from Last 3 Encounters:  08/27/24 101/62  09/27/23 108/75  08/12/22 (!) 101/57   Wt Readings from Last 3 Encounters:  08/27/24 127 lb (57.6 kg)  09/27/23 124 lb 8 oz (56.5 kg)  08/12/22 123 lb (55.8 kg)    ..    08/27/2024    9:18 AM 09/29/2023    7:37 AM 08/12/2022    8:48 AM 05/10/2021    2:42 PM 04/22/2020    8:38 AM  Depression screen PHQ 2/9  Decreased Interest 0 0 1 1 0  Down, Depressed, Hopeless 0 0 1 1 1   PHQ - 2 Score 0 0 2 2 1   Altered sleeping 1  1 1 2   Tired, decreased energy 3  1 0 0  Change in appetite 0  0 0 0  Feeling bad or failure about yourself  0  1 1 0  Trouble concentrating 0  1 1 0  Moving slowly or fidgety/restless 0  1 1 0  Suicidal thoughts 0  0 0 0  PHQ-9 Score 4  7  6  3    Difficult doing work/chores Somewhat difficult  Somewhat difficult Somewhat difficult Not difficult at all     Data saved with a previous flowsheet row definition   ..    08/27/2024    9:19 AM 08/12/2022    8:48 AM 05/10/2021    2:42 PM 04/22/2020    8:38 AM  GAD 7 : Generalized Anxiety Score  Nervous, Anxious, on Edge 1 1 1 2   Control/stop worrying 1 1 1 1   Worry too much - different things 1 1 1 1   Trouble relaxing 0 1 1 1   Restless 0 1 1 0  Easily annoyed or irritable 0 2 2 1    Afraid - awful might happen 0 0 1 0  Total GAD 7 Score 3 7 8 6   Anxiety Difficulty Somewhat difficult Somewhat difficult Somewhat difficult Somewhat difficult      Physical Exam  BP 101/62   Pulse (!) 57   Ht 5' 2 (1.575 m)   Wt 127 lb (57.6 kg)  SpO2 100%   BMI 23.23 kg/m   General Appearance:    Alert, cooperative, no distress, appears stated age  Head:    Normocephalic, without obvious abnormality, atraumatic  Eyes:    PERRL, conjunctiva/corneas clear, EOM's intact, fundi    benign, both eyes  Ears:    Normal TM's and external ear canals, both ears  Nose:   Nares normal, septum midline, mucosa normal, no drainage    or sinus tenderness  Throat:   Lips, mucosa, and tongue normal; teeth and gums normal  Neck:   Supple, symmetrical, trachea midline, no adenopathy;    thyroid:  no enlargement/tenderness/nodules; no carotid   bruit or JVD  Back:     Symmetric, no curvature, ROM normal, no CVA tenderness  Lungs:     Clear to auscultation bilaterally, respirations unlabored  Chest Wall:    No tenderness or deformity   Heart:    Regular rate and rhythm, S1 and S2 normal, no murmur, rub   or gallop     Abdomen:     Soft, non-tender, bowel sounds active all four quadrants,    no masses, no organomegaly        Extremities:   Extremities normal, atraumatic, no cyanosis or edema  Pulses:   2+ and symmetric all extremities  Skin:   Skin color, texture, turgor normal, no rashes or lesions  Lymph nodes:   Cervical, supraclavicular, and axillary nodes normal  Neurologic:   CNII-XII intact, normal strength, sensation and reflexes    throughout      Assessment & Plan:    Routine Health Maintenance and Physical Exam  Immunization History  Administered Date(s) Administered   Influenza, Seasonal, Injecte, Preservative Fre 08/27/2024   Influenza,inj,Quad PF,6+ Mos 07/21/2016, 08/12/2022   Influenza-Unspecified 07/18/2015, 07/21/2016, 07/31/2023   PFIZER(Purple Top)SARS-COV-2  Vaccination 11/21/2019, 12/12/2019   Tdap 12/09/2011, 05/24/2016    Health Maintenance  Topic Date Due   Mammogram  02/15/2024   COVID-19 Vaccine (3 - Pfizer risk series) 09/11/2024 (Originally 01/09/2020)   Hepatitis B Vaccines 19-59 Average Risk (1 of 3 - 19+ 3-dose series) 08/26/2025 (Originally 02/01/2002)   HPV VACCINES (1 - 3-dose SCDM series) 08/26/2025 (Originally 02/01/2010)   DTaP/Tdap/Td (3 - Td or Tdap) 05/24/2026   Cervical Cancer Screening (HPV/Pap Cotest)  09/26/2028   Influenza Vaccine  Completed   Hepatitis C Screening  Completed   HIV Screening  Completed   Pneumococcal Vaccine  Aged Out   Meningococcal B Vaccine  Aged Out    Discussed health benefits of physical activity, and encouraged her to engage in regular exercise appropriate for her age and condition. SABRAIrwin was seen today for annual exam.  Diagnoses and all orders for this visit:  Encounter for annual physical exam -     CBC with Differential/Platelet -     CMP14+EGFR -     Lipid panel -     VITAMIN D 25 Hydroxy (Vit-D Deficiency, Fractures) -     Vitamin B12 -     TSH + free T4  Immunization due -     Flu vaccine trivalent PF, 6mos and older(Flulaval,Afluria,Fluarix,Fluzone)  Herpes simplex vulvovaginitis -     valACYclovir  (VALTREX ) 1000 MG tablet; Take 1 tablet (1,000 mg total) by mouth daily.  GAD (generalized anxiety disorder)  Panic attacks -     vortioxetine  HBr (TRINTELLIX ) 20 MG TABS tablet; Take 1 tablet (20 mg total) by mouth daily. -     ALPRAZolam  (XANAX ) 0.5 MG tablet; Take 1  tablet (0.5 mg total) by mouth 2 (two) times daily as needed for anxiety.  Screening for diabetes mellitus -     CMP14+EGFR  Screening for lipid disorders -     Lipid panel  Anxiety -     vortioxetine  HBr (TRINTELLIX ) 20 MG TABS tablet; Take 1 tablet (20 mg total) by mouth daily. -     ALPRAZolam  (XANAX ) 0.5 MG tablet; Take 1 tablet (0.5 mg total) by mouth 2 (two) times daily as needed for  anxiety.  Major depressive disorder with single episode, in full remission -     vortioxetine  HBr (TRINTELLIX ) 20 MG TABS tablet; Take 1 tablet (20 mg total) by mouth daily.  Recent skin changes  History of MRSA infection   Assessment & Plan Adult Wellness Visit Blood pressure controlled. Cardiovascular risk low at 0.3%. - Fasting labs ordered.  - Checked vitamin D levels. - Encouraged intake of 1000 units of vitamin D daily. - Encouraged dietary calcium intake with 4-5 servings per day. - Continue regular exercise regimen including cardio and strength training.  Anxiety Managed with Xanax  and Trintellix  20. - Continue Xanax  as needed, not exceeding once a week. - Continue Trintellix  20.  Herpes simplex virus infection Managed with Valtrex  during outbreaks. - Continue Valtrex  during outbreaks.  History of MRSA skin infection Concerns about skin care products causing breakouts. - Referred to dermatology for skin care and evaluation.  General Health Maintenance Mammogram scheduled. Pap smear up to date. Flu vaccination administered. Dental and eye exams due. - Ensure mammogram is completed. - Schedule dental and eye exams.    Return in about 1 year (around 08/27/2025).     Correy Weidner, PA-C

## 2024-08-27 NOTE — Patient Instructions (Signed)
 Will make referral to dermatology.   Health Maintenance, Female Adopting a healthy lifestyle and getting preventive care are important in promoting health and wellness. Ask your health care provider about: The right schedule for you to have regular tests and exams. Things you can do on your own to prevent diseases and keep yourself healthy. What should I know about diet, weight, and exercise? Eat a healthy diet  Eat a diet that includes plenty of vegetables, fruits, low-fat dairy products, and lean protein. Do not eat a lot of foods that are high in solid fats, added sugars, or sodium. Maintain a healthy weight Body mass index (BMI) is used to identify weight problems. It estimates body fat based on height and weight. Your health care provider can help determine your BMI and help you achieve or maintain a healthy weight. Get regular exercise Get regular exercise. This is one of the most important things you can do for your health. Most adults should: Exercise for at least 150 minutes each week. The exercise should increase your heart rate and make you sweat (moderate-intensity exercise). Do strengthening exercises at least twice a week. This is in addition to the moderate-intensity exercise. Spend less time sitting. Even light physical activity can be beneficial. Watch cholesterol and blood lipids Have your blood tested for lipids and cholesterol at 41 years of age, then have this test every 5 years. Have your cholesterol levels checked more often if: Your lipid or cholesterol levels are high. You are older than 41 years of age. You are at high risk for heart disease. What should I know about cancer screening? Depending on your health history and family history, you may need to have cancer screening at various ages. This may include screening for: Breast cancer. Cervical cancer. Colorectal cancer. Skin cancer. Lung cancer. What should I know about heart disease, diabetes, and high  blood pressure? Blood pressure and heart disease High blood pressure causes heart disease and increases the risk of stroke. This is more likely to develop in people who have high blood pressure readings or are overweight. Have your blood pressure checked: Every 3-5 years if you are 40-37 years of age. Every year if you are 48 years old or older. Diabetes Have regular diabetes screenings. This checks your fasting blood sugar level. Have the screening done: Once every three years after age 51 if you are at a normal weight and have a low risk for diabetes. More often and at a younger age if you are overweight or have a high risk for diabetes. What should I know about preventing infection? Hepatitis B If you have a higher risk for hepatitis B, you should be screened for this virus. Talk with your health care provider to find out if you are at risk for hepatitis B infection. Hepatitis C Testing is recommended for: Everyone born from [birth year] through 1963. Anyone with known risk factors for hepatitis C. Sexually transmitted infections (STIs) Get screened for STIs, including gonorrhea and chlamydia, if: You are sexually active and are younger than 41 years of age. You are older than 41 years of age and your health care provider tells you that you are at risk for this type of infection. Your sexual activity has changed since you were last screened, and you are at increased risk for chlamydia or gonorrhea. Ask your health care provider if you are at risk. Ask your health care provider about whether you are at high risk for HIV. Your health care provider may recommend  a prescription medicine to help prevent HIV infection. If you choose to take medicine to prevent HIV, you should first get tested for HIV. You should then be tested every 3 months for as long as you are taking the medicine. Pregnancy If you are about to stop having your period (premenopausal) and you may become pregnant, seek counseling  before you get pregnant. Take 400 to 800 micrograms (mcg) of folic acid every day if you become pregnant. Ask for birth control (contraception) if you want to prevent pregnancy. Osteoporosis and menopause Osteoporosis is a disease in which the bones lose minerals and strength with aging. This can result in bone fractures. If you are 9 years old or older, or if you are at risk for osteoporosis and fractures, ask your health care provider if you should: Be screened for bone loss. Take a calcium or vitamin D supplement to lower your risk of fractures. Be given hormone replacement therapy (HRT) to treat symptoms of menopause. Follow these instructions at home: Alcohol use Do not drink alcohol if: Your health care provider tells you not to drink. You are pregnant, may be pregnant, or are planning to become pregnant. If you drink alcohol: Limit how much you have to: 0-1 drink a day. Know how much alcohol is in your drink. In the [place] one drink equals one 12 oz bottle of beer (355 mL), one 5 oz glass of wine (148 mL), or one 1 oz glass of hard liquor (44 mL). Lifestyle Do not use any products that contain nicotine or tobacco. These products include cigarettes, chewing tobacco, and vaping devices, such as e-cigarettes. If you need help quitting, ask your health care provider. Do not use street drugs. Do not share needles. Ask your health care provider for help if you need support or information about quitting drugs. General instructions Schedule regular health, dental, and eye exams. Stay current with your vaccines. Tell your health care provider if: You often feel depressed. You have ever been abused or do not feel safe at home. Summary Adopting a healthy lifestyle and getting preventive care are important in promoting health and wellness. Follow your health care provider's instructions about healthy diet, exercising, and getting tested or screened for diseases. Follow your health care  provider's instructions on monitoring your cholesterol and blood pressure. This information is not intended to replace advice given to you by your health care provider. Make sure you discuss any questions you have with your health care provider. Document Revised: 02/22/2021 Document Reviewed: 02/22/2021 Elsevier Patient Education  2024 [employer].

## 2024-08-28 ENCOUNTER — Ambulatory Visit: Payer: Self-pay | Admitting: Physician Assistant

## 2024-08-28 LAB — CBC WITH DIFFERENTIAL/PLATELET
Basophils Absolute: 0 x10E3/uL (ref 0.0–0.2)
Basos: 0 %
EOS (ABSOLUTE): 0 x10E3/uL (ref 0.0–0.4)
Eos: 1 %
Hematocrit: 37.4 % (ref 34.0–46.6)
Hemoglobin: 12.1 g/dL (ref 11.1–15.9)
Immature Grans (Abs): 0 x10E3/uL (ref 0.0–0.1)
Immature Granulocytes: 0 %
Lymphocytes Absolute: 1.5 x10E3/uL (ref 0.7–3.1)
Lymphs: 29 %
MCH: 30 pg (ref 26.6–33.0)
MCHC: 32.4 g/dL (ref 31.5–35.7)
MCV: 93 fL (ref 79–97)
Monocytes Absolute: 0.4 x10E3/uL (ref 0.1–0.9)
Monocytes: 7 %
Neutrophils Absolute: 3.4 x10E3/uL (ref 1.4–7.0)
Neutrophils: 63 %
Platelets: 218 x10E3/uL (ref 150–450)
RBC: 4.04 x10E6/uL (ref 3.77–5.28)
RDW: 12.6 % (ref 11.7–15.4)
WBC: 5.4 x10E3/uL (ref 3.4–10.8)

## 2024-08-28 LAB — CMP14+EGFR
ALT: 15 IU/L (ref 0–32)
AST: 18 IU/L (ref 0–40)
Albumin: 4.3 g/dL (ref 3.9–4.9)
Alkaline Phosphatase: 43 IU/L (ref 41–116)
BUN/Creatinine Ratio: 15 (ref 9–23)
BUN: 12 mg/dL (ref 6–24)
Bilirubin Total: 0.4 mg/dL (ref 0.0–1.2)
CO2: 21 mmol/L (ref 20–29)
Calcium: 8.8 mg/dL (ref 8.7–10.2)
Chloride: 105 mmol/L (ref 96–106)
Creatinine, Ser: 0.78 mg/dL (ref 0.57–1.00)
Globulin, Total: 2.2 g/dL (ref 1.5–4.5)
Glucose: 85 mg/dL (ref 70–99)
Potassium: 4.2 mmol/L (ref 3.5–5.2)
Sodium: 139 mmol/L (ref 134–144)
Total Protein: 6.5 g/dL (ref 6.0–8.5)
eGFR: 98 mL/min/1.73 (ref >59)

## 2024-08-28 LAB — LIPID PANEL
Chol/HDL Ratio: 3.3 ratio (ref 0.0–4.4)
Cholesterol, Total: 179 mg/dL (ref 100–199)
HDL: 55 mg/dL (ref >39)
LDL Chol Calc (NIH): 111 mg/dL — ABNORMAL HIGH (ref 0–99)
Triglycerides: 68 mg/dL (ref 0–149)
VLDL Cholesterol Cal: 13 mg/dL (ref 5–40)

## 2024-08-28 LAB — VITAMIN B12: Vitamin B-12: 402 pg/mL (ref 232–1245)

## 2024-08-28 LAB — TSH+FREE T4
Free T4: 1.02 ng/dL (ref 0.82–1.77)
TSH: 1.73 u[IU]/mL (ref 0.450–4.500)

## 2024-08-28 LAB — VITAMIN D 25 HYDROXY (VIT D DEFICIENCY, FRACTURES): Vit D, 25-Hydroxy: 39.8 ng/mL (ref 30.0–100.0)

## 2024-08-28 NOTE — Progress Notes (Signed)
 Sierra Morrison,   Labs look good.  Cholesterol stable. No increase or decrease. Overall 10 year cardiovascular risk is low. Keep up the good work recheck in 1 year.   SABRA.The 10-year ASCVD risk score (Arnett DK, et al., 2019) is: 0.3%   Values used to calculate the score:     Age: 43 years     Clincally relevant sex: Female     Is Non-Hispanic African American: No     Diabetic: No     Tobacco smoker: No     Systolic Blood Pressure: 101 mmHg     Is BP treated: No     HDL Cholesterol: 55 mg/dL     Total Cholesterol: 179 mg/dL

## 2024-09-03 ENCOUNTER — Ambulatory Visit

## 2024-09-03 DIAGNOSIS — Z1231 Encounter for screening mammogram for malignant neoplasm of breast: Secondary | ICD-10-CM | POA: Diagnosis not present

## 2024-09-04 ENCOUNTER — Ambulatory Visit: Payer: Self-pay | Admitting: Physician Assistant

## 2024-09-04 NOTE — Progress Notes (Signed)
 Normal mammogram

## 2024-10-18 ENCOUNTER — Encounter: Payer: Self-pay | Admitting: Physician Assistant

## 2024-10-18 ENCOUNTER — Ambulatory Visit: Payer: Self-pay

## 2024-10-18 ENCOUNTER — Ambulatory Visit: Admitting: Physician Assistant

## 2024-10-18 VITALS — BP 139/70 | HR 81 | Temp 98.7°F | Ht 62.0 in | Wt 127.0 lb

## 2024-10-18 DIAGNOSIS — Z8614 Personal history of Methicillin resistant Staphylococcus aureus infection: Secondary | ICD-10-CM | POA: Diagnosis not present

## 2024-10-18 DIAGNOSIS — L0201 Cutaneous abscess of face: Secondary | ICD-10-CM | POA: Diagnosis not present

## 2024-10-18 MED ORDER — DOXYCYCLINE HYCLATE 100 MG PO TABS: 100.0000 mg | ORAL_TABLET | Freq: Two times a day (BID) | ORAL | 0 refills | Status: AC

## 2024-10-18 NOTE — Progress Notes (Signed)
 "  Acute Office Visit  Subjective:     Patient ID: Sierra Morrison, female    DOB: [birth year], 44 y.o.   MRN: [id number]  No chief complaint on file.   HPI .Discussed the use of AI scribe software for clinical note transcription with the patient, who gave verbal consent to proceed.  History of Present Illness Sierra Morrison is a 44 year old female who presents with a recurrent facial abscess.  Facial abscess - Painful lesion developed on face three days ago, initially mistaken for a pimple - Lesion has become increasingly swollen and sensitive, with a 'pulsing' sensation - Entire face affected by swelling and tenderness - No fever or sore throat - Concern for potential scarring, as previous lesion left a scar despite not requiring incision  Treatment history and response - Applied zit patch for two to three nights without improvement - Previously treated similar lesion in August with mupirocin cream three times daily, oral antibiotics, and warm compresses, which resolved without incision - Currently applying mupirocin cream from previous episode  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     ROS See HPI.      Objective:    BP 139/70   Pulse 81   Temp 98.7 F (37.1 C) (Oral)   Ht 5' 2 (1.575 m)   Wt 127 lb (57.6 kg)   SpO2 99%   BMI 23.23 kg/m  BP Readings from Last 3 Encounters:  10/18/24 139/70  08/27/24 101/62  09/27/23 108/75   Wt Readings from Last 3 Encounters:  10/18/24 127 lb (57.6 kg)  08/27/24 127 lb (57.6 kg)  09/27/23 124 lb 8 oz (56.5 kg)      Physical Exam HENT:     Head: Normocephalic.  Cardiovascular:     Rate and Rhythm: Normal rate.  Pulmonary:     Effort: Pulmonary effort is normal.  Musculoskeletal:     Cervical back: No tenderness.   Lymphadenopathy:     Cervical: No cervical adenopathy.  Skin:    Comments: Right side of face just below cheek and above jaw line 1cm by 1cm erythematous papule surrounded by 2cm by 2cm area of induration and tenderness.   Neurological:     General: No focal deficit present.     Mental Status: She is alert and oriented to person, place, and time.  Psychiatric:        Mood and Affect: Mood normal.          Assessment & Plan:  .Diagnoses and all orders for this visit:  Abscess of face -     doxycycline (VIBRA-TABS) 100 MG tablet; Take 1 tablet (100 mg total) by mouth 2 (two) times daily.  History of MRSA infection -     doxycycline (VIBRA-TABS) 100 MG tablet; Take 1 tablet (100 mg total) by mouth 2 (two) times daily.   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 Staph colonization in the nose may contribute to recurrence. - Continue mupirocin 2% cream application three times daily. - Apply warm compresses to the affected area. - Start doxycycline as soon as possible. - Use mupirocin nasal application with a Q-tip once every two weeks to reduce staph colonization. - Consider using pimple patches to aid in drainage if the abscess  opens.   Klayten Jolliff, PA-C   "

## 2024-10-18 NOTE — Patient Instructions (Signed)
 Skin Abscess    A skin abscess is an infected spot of skin. It can have pus in it. An abscess can happen in any part of your body.  Some abscesses break open (rupture) on their own. Most keep getting worse unless they are treated. If your abscess is not treated, the infection can spread deeper into your body and blood. This can make you feel sick.  What are the causes?  Germs that enter your skin. This may happen if you have:  A cut or scrape.  A wound from a needle or an insect bite.  Blocked oil or sweat glands.  A problem with the spot where your hair goes into your skin.  A fluid-filled sac called a cyst under your skin.  What increases the risk?  Having problems with how your blood moves through your body.  Having a weak body defense system (immune system).  Having diabetes.  Having dry and irritated skin.  Needing to get shots often.  Putting drugs into your body with a needle.  Having a splinter or something else in your skin.  Smoking.  What are the signs or symptoms?  A firm bump under your skin that hurts.  A bump with pus at the top.  Redness and swelling.  Warm or tender spots.  A sore on the skin.  How is this treated?  You may need to:  Put a heat pack or a warm, wet washcloth on the spot.  Have the pus drained.  Take antibiotics.  Follow these instructions at home:  Medicines  Take over-the-counter and prescription medicines only as told by your doctor.  If you were prescribed antibiotics, take them as told by your doctor. Do not stop taking them even if you start to feel better.  Abscess care    If you have an abscess that has not drained, put heat on it. Use the heat source that your doctor recommends, such as a moist heat pack or a heating pad.  Place a towel between your skin and the heat source.  Leave the heat on for 20-30 minutes.  If your skin turns bright red, take off the heat right away to prevent burns. The risk of burns is higher if you cannot feel pain, heat, or cold.  Follow  instructions from your doctor about how to take care of your abscess. Make sure you:  Cover the abscess with a bandage.  Wash your hands with soap and water for at least 20 seconds before and after you change your bandage. If you cannot use soap and water, use hand sanitizer.  Change your bandage as told by your doctor.  Check your abscess every day for signs that the infection is getting worse. Check for:  More redness, swelling, or pain.  More fluid or blood.  Warmth.  More pus or a worse smell.  General instructions  To keep the infection from spreading:  Do not share personal items or towels.  Do not go in a hot tub with others.  Avoid making skin contact with others.  Be careful when you get rid of used bandages or any pus from the abscess.  Do not smoke or use any products that contain nicotine or tobacco. If you need help quitting, ask your doctor.  Contact a doctor if:  You see red streaks on your skin near the abscess.  You have any signs of worse infection.  You vomit every time you eat or drink.  You have  a fever, chills, or muscle aches.  The cyst or abscess comes back.  Get help right away if:  You have very bad pain.  You make less pee (urine) than normal.  This information is not intended to replace advice given to you by your health care provider. Make sure you discuss any questions you have with your health care provider.  Document Revised: 05/18/2022 Document Reviewed: 05/18/2022  Elsevier Patient Education  2024 [employer].

## 2024-10-18 NOTE — Telephone Encounter (Signed)
 FYI Only or Action Required?: FYI only for provider: appointment scheduled on 10/19/23.  Patient was last seen in primary care on 08/27/2024 by Antoniette Vermell CROME, PA-C.  Called Nurse Triage reporting Abscess.  Symptoms began several days ago.  Interventions attempted: Prescription medications: Mupirocin ointment.  Symptoms are: gradually worsening.  Triage Disposition: See Physician Within 24 Hours  Patient/caregiver understands and will follow disposition?: Yes            Copied from CRM #[id number]. Topic: Clinical - Red Word Triage >> Oct 18, 2024  7:37 AM Larissa RAMAN wrote: Red Word that prompted transfer to Nurse Triage: Face- rash- redness, pain Reason for Disposition  [1] Boil > 1/4 inch across (> 6 mm; larger than a pencil eraser) AND [2] on face  Answer Assessment - Initial Assessment Questions 1. APPEARANCE of BOIL: What does the boil look like?      Hard lump/bump that is red. No white head or pus center.  2. LOCATION: Where is the boil located?      Lower right side of face just below mouth.  3. NUMBER: How many boils are there?      1.  4. SIZE: How big is the boil? (e.g., inches, cm; compare to size of a coin or other object)     Dime.  5. ONSET: When did the boil start?     Few days.  6. PAIN: Is there any pain? If Yes, ask: How bad is the pain?   (Scale 1-10; or mild, moderate, severe)     4-5/10. No OTC medications taken so far.  7. FEVER: Do you have a fever? If Yes, ask: What is it, how was it measured, and when did it start?      No.  8. SOURCE: Have you been around anyone with boils or other Staph infections? Have you ever had boils before?     History of MRSA in August.  9. OTHER SYMPTOMS: Do you have any other symptoms? (e.g., shaking chills, weakness, rash elsewhere on body)     Headache. No chills, fever, rash elsewhere.  10. PREGNANCY: Is there any chance you are pregnant? When was your last menstrual period?        N/A.  Protocols used: Boil (Skin Abscess)-A-AH

## 2025-08-29 ENCOUNTER — Encounter: Admitting: Physician Assistant
